# Patient Record
Sex: Male | Born: 1945
Health system: Southern US, Community
[De-identification: ages and names within clinical notes are randomized; demographics above are authoritative.]

## PROBLEM LIST (undated history)

## (undated) DIAGNOSIS — C61 Malignant neoplasm of prostate: Secondary | ICD-10-CM

## (undated) HISTORY — PX: MANDIBLE RECONSTRUCTION: SHX431

## (undated) HISTORY — PX: UMBILICAL HERNIA REPAIR: SHX196

## (undated) HISTORY — PX: LEG SURGERY: SHX1003

## (undated) HISTORY — PX: CATARACT EXTRACTION: SUR2

---

## 1998-04-11 ENCOUNTER — Emergency Department (HOSPITAL_COMMUNITY): Admission: EM | Admit: 1998-04-11 | Discharge: 1998-04-11 | Payer: Self-pay | Admitting: Emergency Medicine

## 2001-05-10 ENCOUNTER — Inpatient Hospital Stay (HOSPITAL_COMMUNITY): Admission: AC | Admit: 2001-05-10 | Discharge: 2001-05-14 | Payer: Self-pay

## 2001-05-10 ENCOUNTER — Encounter: Payer: Self-pay | Admitting: Emergency Medicine

## 2001-05-11 ENCOUNTER — Encounter: Payer: Self-pay | Admitting: General Surgery

## 2001-05-11 ENCOUNTER — Encounter: Payer: Self-pay | Admitting: Orthopedic Surgery

## 2001-05-13 ENCOUNTER — Encounter: Payer: Self-pay | Admitting: General Surgery

## 2001-05-14 ENCOUNTER — Inpatient Hospital Stay (HOSPITAL_COMMUNITY)
Admission: RE | Admit: 2001-05-14 | Discharge: 2001-05-20 | Payer: Self-pay | Admitting: Physical Medicine & Rehabilitation

## 2002-05-21 ENCOUNTER — Emergency Department (HOSPITAL_COMMUNITY): Admission: EM | Admit: 2002-05-21 | Discharge: 2002-05-21 | Payer: Self-pay | Admitting: Emergency Medicine

## 2010-09-13 ENCOUNTER — Other Ambulatory Visit: Payer: Self-pay

## 2010-09-13 ENCOUNTER — Inpatient Hospital Stay (HOSPITAL_COMMUNITY): Payer: Self-pay

## 2010-09-13 ENCOUNTER — Encounter: Payer: Self-pay | Admitting: *Deleted

## 2010-09-13 ENCOUNTER — Inpatient Hospital Stay (HOSPITAL_COMMUNITY)
Admission: EM | Admit: 2010-09-13 | Discharge: 2010-09-14 | DRG: 641 | Disposition: A | Discharge status: Home / Self Care | Payer: Self-pay | Attending: Internal Medicine | Admitting: Internal Medicine

## 2010-09-13 DIAGNOSIS — E860 Dehydration: Principal | ICD-10-CM | POA: Diagnosis present

## 2010-09-13 DIAGNOSIS — I498 Other specified cardiac arrhythmias: Secondary | ICD-10-CM | POA: Diagnosis present

## 2010-09-13 DIAGNOSIS — R001 Bradycardia, unspecified: Secondary | ICD-10-CM | POA: Diagnosis present

## 2010-09-13 DIAGNOSIS — R55 Syncope and collapse: Secondary | ICD-10-CM | POA: Diagnosis present

## 2010-09-13 LAB — COMPREHENSIVE METABOLIC PANEL
ALT: 14 U/L (ref 0–53)
Albumin: 3.8 g/dL (ref 3.5–5.2)
Alkaline Phosphatase: 73 U/L (ref 39–117)
Potassium: 4 mEq/L (ref 3.5–5.1)
Sodium: 138 mEq/L (ref 135–145)
Total Protein: 7.1 g/dL (ref 6.0–8.3)

## 2010-09-13 LAB — CBC
MCH: 31.7 pg (ref 26.0–34.0)
MCHC: 34.6 g/dL (ref 30.0–36.0)
Platelets: 192 10*3/uL (ref 150–400)
RBC: 4.16 MIL/uL — ABNORMAL LOW (ref 4.22–5.81)

## 2010-09-13 LAB — DIFFERENTIAL
Basophils Absolute: 0 10*3/uL (ref 0.0–0.1)
Basophils Relative: 1 % (ref 0–1)
Eosinophils Absolute: 0.3 10*3/uL (ref 0.0–0.7)
Neutrophils Relative %: 61 % (ref 43–77)

## 2010-09-13 MED ORDER — ONDANSETRON HCL 4 MG/2ML IJ SOLN: 4.0000 mg | Freq: Four times a day (QID) | INTRAMUSCULAR | Status: DC | PRN

## 2010-09-13 MED ORDER — SODIUM CHLORIDE 0.9 % IJ SOLN: 3.0000 mL | INTRAMUSCULAR | Status: DC | PRN

## 2010-09-13 MED ORDER — SODIUM CHLORIDE 0.9 % IJ SOLN
3.0000 mL | Freq: Two times a day (BID) | INTRAMUSCULAR | Status: DC
  Administered 2010-09-13 – 2010-09-14 (×2): 3 mL via INTRAVENOUS
  Filled 2010-09-13: qty 3

## 2010-09-13 MED ORDER — ONDANSETRON HCL 4 MG PO TABS: 4.0000 mg | ORAL_TABLET | Freq: Four times a day (QID) | ORAL | Status: DC | PRN

## 2010-09-13 MED ORDER — SODIUM CHLORIDE 0.9 % IV SOLN: 250.0000 mL | INTRAVENOUS | Status: DC

## 2010-09-13 NOTE — ED Notes (Signed)
C/o "almost passed out" walking from bathroom to bedroom at home; pt diaphoretic upon EMS arrival; skin cool and clammy presently; CBG en route 109 mg/dl; a&ox4; answers questions appropriately.

## 2010-09-13 NOTE — ED Notes (Signed)
Called floor back and was able to give report, report called to Morrie Sheldon, RN on unit 300

## 2010-09-13 NOTE — ED Notes (Signed)
Attempted to call report, nurse unable to take report and will call back when able, receiving nurse is Tish

## 2010-09-13 NOTE — ED Notes (Signed)
Nurse called back and unable to give

## 2010-09-13 NOTE — ED Notes (Signed)
Placed on cardiac monitor and O2 2L/min per [place]; monitor shows sinus brady with rate of 48.

## 2010-09-13 NOTE — ED Provider Notes (Signed)
Scribed for Troy Hutching, MD, the patient was seen in room 6. This chart was scribed by Jannette Fogo. This patient's care was started at 15:35.    CSN: [id number] Arrival date & time: 09/13/2010  2:13 PM  Chief Complaint  Patient presents with  . Near Syncope   HPI Troy Walter is a 65 y.o. male brought in by ambulance, who presents to the Emergency Department for near-syncope. Patient reports he developed a sudden onset of lightheadedness/dizziness and "almost passed out" today at 13:30PM while walking from bathroom to bedroom. Per spouse, he became pale and diaphoretic, and "was out of it" for ~ 1 minute. Spouse states it took ~5 minutes for the patient to start acting appropriately. Additionally, family reports a history of similar near-syncopal episode ~2 days ago.  Patient recently moved to [place] from [place] and does not have a PCP int his area. He was recently treated for testicular swelling with antibiotics and symptoms improved. Patient denies any history HTN, diabetes mellitus, CVA, AMI, or any chronic medical problems. There are no other associated symptoms and no other alleviating or aggravating factors.   HPI ELEMENTS:  Location: generalized   Onset: Today at 13:30PM Duration: ~5 minutes   Severity: no pain    Context: as above    PAST MEDICAL HISTORY:  Hernia  History reviewed. No pertinent past medical history.   PAST SURGICAL HISTORY:  History reviewed. No pertinent past surgical history.   MEDICATIONS:  Previous Medications   POTASSIUM CHLORIDE (KLOR-CON) 8 MEQ CR TABLET    Take 8 mEq by mouth daily.      ALLERGIES:  Allergies as of 09/13/2010  . (No Known Allergies)     FAMILY HISTORY:  No Pertinent Family History  SOCIAL HISTORY: Accompanied to the ED by spouse History  Substance Use Topics  . Smoking status: Never Smoker   . Smokeless tobacco: Not on file  . Alcohol Use: No    Review of Systems  Constitutional: Positive for diaphoresis.    Neurological: Positive for dizziness, syncope and light-headedness.  All other systems reviewed and are negative.    Physical Exam  BP 138/81  Pulse 53  Temp(Src) 97.7 F (36.5 C) (Oral)  Resp 18  Ht 5\' 11"  (1.803 m)  Wt 211 lb (95.709 kg)  BMI 29.43 kg/m2  SpO2 98%  Physical Exam  Constitutional: He is oriented to person, place, and time. He appears well-developed and well-nourished. No distress.  HENT:  Head: Normocephalic and atraumatic.  Mouth/Throat: Oropharynx is clear and moist.  Eyes: Pupils are equal, round, and reactive to light.  Neck: Normal range of motion. Neck supple.  Cardiovascular: Regular rhythm and normal heart sounds.  Bradycardia present.   Pulmonary/Chest: Effort normal and breath sounds normal.  Abdominal: Soft. There is no tenderness.       Umbilical hernia, easily reduced   Musculoskeletal: Normal range of motion. He exhibits no edema and no tenderness.  Neurological: He is alert and oriented to person, place, and time.  Skin: Skin is warm and dry. He is not diaphoretic. No pallor.  Psychiatric: He has a normal mood and affect. His behavior is normal.   OTHER DATA REVIEWED: Nursing notes, vital signs, and past medical records reviewed.  DIAGNOSTIC STUDIES: Oxygen Saturation is 98% on [place], adequate by my interpretation.    Cardiac Monitor: bradycardia with a rate of 52 bpm. No ectopy.   EKG:  Date: 09/13/2010  Rate: 48  Rhythm: sinus bradycardia  QRS Axis: normal  Intervals: normal  ST/T Wave abnormalities: normal  Conduction Disutrbances:first-degree A-V block   Narrative Interpretation:   Old EKG Reviewed: none available   LABS / RADIOLOGY:  Results for orders placed during the hospital encounter of 09/13/10  CBC      Component Value Range   WBC 5.4  4.0 - 10.5 (K/uL)   RBC 4.16 (*) 4.22 - 5.81 (MIL/uL)   Hemoglobin 13.2  13.0 - 17.0 (g/dL)   HCT 16.1 (*) 09.6 - 52.0 (%)   MCV 91.8  78.0 - 100.0 (fL)   MCH 31.7  26.0 - 34.0 (pg)    MCHC 34.6  30.0 - 36.0 (g/dL)   RDW 04.5  40.9 - 81.1 (%)   Platelets 192  150 - 400 (K/uL)  DIFFERENTIAL      Component Value Range   Neutrophils Relative 61  43 - 77 (%)   Neutro Abs 3.3  1.7 - 7.7 (K/uL)   Lymphocytes Relative 28  12 - 46 (%)   Lymphs Abs 1.5  0.7 - 4.0 (K/uL)   Monocytes Relative 5  3 - 12 (%)   Monocytes Absolute 0.3  0.1 - 1.0 (K/uL)   Eosinophils Relative 5  0 - 5 (%)   Eosinophils Absolute 0.3  0.0 - 0.7 (K/uL)   Basophils Relative 1  0 - 1 (%)   Basophils Absolute 0.0  0.0 - 0.1 (K/uL)  COMPREHENSIVE METABOLIC PANEL      Component Value Range   Sodium 138  135 - 145 (mEq/L)   Potassium 4.0  3.5 - 5.1 (mEq/L)   Chloride 104  96 - 112 (mEq/L)   CO2 29  19 - 32 (mEq/L)   Glucose, Bld 97  70 - 99 (mg/dL)   BUN 10  6 - 23 (mg/dL)   Creatinine, Ser 9.14  0.50 - 1.35 (mg/dL)   Calcium 9.3  8.4 - 78.2 (mg/dL)   Total Protein 7.1  6.0 - 8.3 (g/dL)   Albumin 3.8  3.5 - 5.2 (g/dL)   AST 17  0 - 37 (U/L)   ALT 14  0 - 53 (U/L)   Alkaline Phosphatase 73  39 - 117 (U/L)   Total Bilirubin 0.9  0.3 - 1.2 (mg/dL)   GFR calc non Af Amer >60  >60 (mL/min)   GFR calc Af Amer >60  >60 (mL/min)    ED COURSE / COORDINATION OF CARE: 15:40 - Patient was evaluated by ED physician. Labs and EKG ordered.  19:25 - The case was discussed with [facility] Hospitalist regarding admission   MDM:    IMPRESSION: Diagnoses that have been ruled out:  Diagnoses that are still under consideration:  Final diagnoses:  Near syncope    PLAN:  ADMISSION   CONDITION ON ADMISSION: Stable   MEDICATIONS GIVEN IN THE E.D.  Medications  potassium chloride (KLOR-CON) 8 MEQ CR tablet (not administered)    Procedures   I personally performed the services described in this documentation, which was scribed in my presence. The recorded information has been reviewed and considered. No att. providers found     Troy Hutching, MD 09/30/10 1616

## 2010-09-14 ENCOUNTER — Inpatient Hospital Stay (HOSPITAL_COMMUNITY): Payer: Self-pay

## 2010-09-14 DIAGNOSIS — R55 Syncope and collapse: Secondary | ICD-10-CM | POA: Diagnosis present

## 2010-09-14 DIAGNOSIS — R001 Bradycardia, unspecified: Secondary | ICD-10-CM | POA: Diagnosis present

## 2010-09-14 LAB — TSH: TSH: 1.816 u[IU]/mL (ref 0.350–4.500)

## 2010-09-14 LAB — CARDIAC PANEL(CRET KIN+CKTOT+MB+TROPI)
CK, MB: 4.5 ng/mL — ABNORMAL HIGH (ref 0.3–4.0)
Relative Index: 1.9 (ref 0.0–2.5)
Total CK: 212 U/L (ref 7–232)

## 2010-09-14 LAB — BASIC METABOLIC PANEL
Chloride: 105 mEq/L (ref 96–112)
Creatinine, Ser: 1 mg/dL (ref 0.50–1.35)
GFR calc Af Amer: >60 mL/min (ref >60)
GFR calc non Af Amer: >60 mL/min (ref >60)
Potassium: 3.9 mEq/L (ref 3.5–5.1)

## 2010-09-14 LAB — CBC
MCH: 32.4 pg (ref 26.0–34.0)
MCHC: 35.3 g/dL (ref 30.0–36.0)
RDW: 13.2 % (ref 11.5–15.5)

## 2010-09-14 NOTE — Discharge Summary (Signed)
Physician Discharge Summary  Patient ID: Troy Walter MRN: [id number] DOB/AGE: [birth year] 65 y.o.  Admit date: 09/13/2010 Discharge date: 09/14/2010  Discharge Diagnoses:  Principal Problem:  *Syncope Active Problems:  Bradycardia   Current Discharge Medication List    STOP taking these medications     potassium chloride (KLOR-CON) 8 MEQ CR tablet         Discharge Orders    Future Orders Please Complete By Expires   Diet general      Increase activity slowly         Follow-up Information    Follow up with Milinda Antis, MD. Make an appointment in 2 weeks.   Contact information:   [address], [address] [place] [address] [phone]          Disposition:  home  Discharged Condition: Stable  Labs:   Results for orders placed during the hospital encounter of 09/13/10 (from the past 48 hour(s))  CBC     Status: Abnormal   Collection Time   09/13/10  4:31 PM      Component Value Range Comment   WBC 5.4  4.0 - 10.5 (K/uL)    RBC 4.16 (*) 4.22 - 5.81 (MIL/uL)    Hemoglobin 13.2  13.0 - 17.0 (g/dL)    HCT 46.9 (*) 62.9 - 52.0 (%)    MCV 91.8  78.0 - 100.0 (fL)    MCH 31.7  26.0 - 34.0 (pg)    MCHC 34.6  30.0 - 36.0 (g/dL)    RDW 52.8  41.3 - 24.4 (%)    Platelets 192  150 - 400 (K/uL)   DIFFERENTIAL     Status: Normal   Collection Time   09/13/10  4:31 PM      Component Value Range Comment   Neutrophils Relative 61  43 - 77 (%)    Neutro Abs 3.3  1.7 - 7.7 (K/uL)    Lymphocytes Relative 28  12 - 46 (%)    Lymphs Abs 1.5  0.7 - 4.0 (K/uL)    Monocytes Relative 5  3 - 12 (%)    Monocytes Absolute 0.3  0.1 - 1.0 (K/uL)    Eosinophils Relative 5  0 - 5 (%)    Eosinophils Absolute 0.3  0.0 - 0.7 (K/uL)    Basophils Relative 1  0 - 1 (%)    Basophils Absolute 0.0  0.0 - 0.1 (K/uL)   COMPREHENSIVE METABOLIC PANEL     Status: Normal   Collection Time   09/13/10  4:31 PM      Component Value Range Comment   Sodium 138  135 - 145 (mEq/L)      Potassium 4.0  3.5 - 5.1 (mEq/L)    Chloride 104  96 - 112 (mEq/L)    CO2 29  19 - 32 (mEq/L)    Glucose, Bld 97  70 - 99 (mg/dL)    BUN 10  6 - 23 (mg/dL)    Creatinine, Ser 0.10  0.50 - 1.35 (mg/dL)    Calcium 9.3  8.4 - 10.5 (mg/dL)    Total Protein 7.1  6.0 - 8.3 (g/dL)    Albumin 3.8  3.5 - 5.2 (g/dL)    AST 17  0 - 37 (U/L)    ALT 14  0 - 53 (U/L)    Alkaline Phosphatase 73  39 - 117 (U/L)    Total Bilirubin 0.9  0.3 - 1.2 (mg/dL)    GFR calc non  Af Amer >60  >60 (mL/min)    GFR calc Af Amer >60  >60 (mL/min)   CARDIAC PANEL(CRET KIN+CKTOT+MB+TROPI)     Status: Abnormal   Collection Time   09/14/10 12:12 AM      Component Value Range Comment   Total CK 240 (*) 7 - 232 (U/L)    CK, MB 4.5 (*) 0.3 - 4.0 (ng/mL)    Troponin I <0.30  <0.30 (ng/mL)    Relative Index 1.9  0.0 - 2.5    BASIC METABOLIC PANEL     Status: Normal   Collection Time   09/14/10  8:08 AM      Component Value Range Comment   Sodium 140  135 - 145 (mEq/L)    Potassium 3.9  3.5 - 5.1 (mEq/L)    Chloride 105  96 - 112 (mEq/L)    CO2 31  19 - 32 (mEq/L)    Glucose, Bld 98  70 - 99 (mg/dL)    BUN 9  6 - 23 (mg/dL)    Creatinine, Ser 0.98  0.50 - 1.35 (mg/dL)    Calcium 9.4  8.4 - 10.5 (mg/dL)    GFR calc non Af Amer >60  >60 (mL/min)    GFR calc Af Amer >60  >60 (mL/min)   CBC     Status: Normal   Collection Time   09/14/10  8:08 AM      Component Value Range Comment   WBC 4.5  4.0 - 10.5 (K/uL)    RBC 4.47  4.22 - 5.81 (MIL/uL)    Hemoglobin 14.5  13.0 - 17.0 (g/dL)    HCT 11.9  14.7 - 82.9 (%)    MCV 91.9  78.0 - 100.0 (fL)    MCH 32.4  26.0 - 34.0 (pg)    MCHC 35.3  30.0 - 36.0 (g/dL)    RDW 56.2  13.0 - 86.5 (%)    Platelets 195  150 - 400 (K/uL)   CARDIAC PANEL(CRET KIN+CKTOT+MB+TROPI)     Status: Abnormal   Collection Time   09/14/10  8:09 AM      Component Value Range Comment   Total CK 212  7 - 232 (U/L)    CK, MB 4.5 (*) 0.3 - 4.0 (ng/mL)    Troponin I <0.30  <0.30 (ng/mL)     Relative Index 2.1  0.0 - 2.5      Diagnostics:  X-ray Chest Pa And Lateral   09/13/2010  *RADIOLOGY REPORT*  Clinical Data: Near syncope; lightheadedness and dizziness.  CHEST - 2 VIEW  Comparison: None.  Findings: The lungs are well-aerated.  Mild left basilar opacity likely reflects atelectasis.  There is no evidence of pleural effusion or pneumothorax.  The heart is normal in size; the mediastinal contour is within normal limits.  No acute osseous abnormalities are seen.  IMPRESSION: Mild left basilar opacity likely reflects atelectasis; lungs otherwise clear.  Original Report Authenticated By: Tonia Ghent, M.D.   Ct Head Wo Contrast  09/13/2010  *RADIOLOGY REPORT*  Clinical Data: Near syncope; dizziness and weakness.  CT HEAD WITHOUT CONTRAST  Technique:  Contiguous axial images were obtained from the base of the skull through the vertex without contrast.  Comparison: None.  Findings: There is no evidence of acute infarction, mass lesion, or intra- or extra-axial hemorrhage on CT.  The posterior fossa, including the cerebellum, brainstem and fourth ventricle, is within normal limits.  The third and lateral ventricles, and basal ganglia are unremarkable in appearance.  The cerebral hemispheres are symmetric in appearance, with normal gray- white differentiation.  No mass effect or midline shift is seen.  There is no evidence of fracture; visualized osseous structures are unremarkable in appearance.  The orbits are within normal limits. The paranasal sinuses and mastoid air cells are well-aerated.  No significant soft tissue abnormalities are seen.  IMPRESSION: Unremarkable noncontrast CT of the head.  Original Report Authenticated By: Tonia Ghent, M.D.   US Carotid Duplex Bilateral  09/14/2010  *RADIOLOGY REPORT*  Clinical Data: Syncope  BILATERAL CAROTID DUPLEX ULTRASOUND  Technique: Wallace Cullens scale imaging, color Doppler and duplex ultrasound was performed of bilateral carotid and vertebral arteries  in the neck.  Comparison:  Head CT 09/13/2010  Criteria:  Quantification of carotid stenosis is based on velocity parameters that correlate the residual internal carotid diameter with NASCET-based stenosis levels, using the diameter of the distal internal carotid lumen as the denominator for stenosis measurement.  The following velocity measurements were obtained:                   PEAK SYSTOLIC/END DIASTOLIC RIGHT ICA:                        112/32cm/sec CCA:                        106/18cm/sec SYSTOLIC ICA/CCA RATIO:     1.1 DIASTOLIC ICA/CCA RATIO:    1.8 ECA:                        135cm/sec  LEFT ICA:                        86/14cm/sec CCA:                        133/18cm/sec SYSTOLIC ICA/CCA RATIO:     0.6 DIASTOLIC ICA/CCA RATIO:    0.8 ECA:                        98cm/sec  Findings:  RIGHT CAROTID ARTERY: Echogenic shadowing plaque is noted at the carotid bulb producing less than 10% stenosis by gray scale criteria.  RIGHT VERTEBRAL ARTERY:  Antegrade  LEFT CAROTID ARTERY: No significant plaque formation.  LEFT VERTEBRAL ARTERY:  Antegrade  Waveform morphologies are normal in the internal and external carotid arterial systems bilaterally.  IMPRESSION: No sonographic evidence for hemodynamically significant stenosis in either carotid arterial system.  Original Report Authenticated By: Harrel Lemon, M.D.   EKG: Sinus bradycardia  Full Code   Hospital Course: Please see H&P for complete admission details. The patient is a 65 year old black male with no significant past medical history who presented with syncope. He had taken some laxative tea and had loose stool. He also had not been drinking much and had been out in the sun. He was on the commode and had a syncopal episode. He had had a previous syncopal episode several days prior to this episode. He had been feeling weak recently. He thinks that he was dehydrated. He had no chest pain, palpitations, seizure or stroke symptoms. He had been feeling  dizzy when standing, malaise and fatigue. He has no primary care physician. He is very active and has chronic bradycardia. Other than bradycardia he had an unremarkable physical examination. He was admitted on a Friday night. Orthostatic  vital signs were negative. CT of the brain, carotid ultrasound, laboratory values, chest x-ray were all unremarkable. An echocardiogram was also ordered by the admitting physician, but this could not be done for several days, as it is the weekend. The patient feels "great" he has had no further symptoms. I suspect he was dehydrated. He remains bradycardic into the 40s and 50s, but he is currently asymptomatic, so unless he dropped even lower, I do not think that this led to his syncope. He is requesting discharge and at this point is stable. I did encourage him to establish a primary care physician. If he has further symptoms, further workup can be done like echocardiogram, Holter monitor, etc. Total time on the day of discharge is greater than 30 minutes.  Discharge Exam: Blood pressure 146/83, pulse 59, temperature 98.1 F (36.7 C), temperature source Oral, resp. rate 19, height 5\' 11"  (1.803 m), weight 95.6 kg (210 lb 12.2 oz), SpO2 98.00%. General alert oriented walking around the room without difficulty Lungs are clear to auscultation bilaterally without wheezes rhonchi or rales Cardiovascular Regular rhythm, mild bradycardia Abdomen soft nontender nondistended Extremities no clubbing cyanosis or edema. No calf tenderness.   SignedChristiane Ha 09/14/2010, 3:27 PM

## 2010-09-14 NOTE — Progress Notes (Signed)
09/14/10 1615 Patient discharged home this afternoon. D/c'd iv site within normal limits, d/c'd telemetry. Reviewed discharge instructions with patient and copy given. No prescriptions or home meds. Pt to schedule f/u appointment in 2 weeks with Dr Jeanice Lim as instructed per Dr Lendell Caprice. Denied pain or discomfort prior to discharge. Pt left floor in stable condition via ambulation (at patient request to walk) accompanied by nurse.

## 2010-09-14 NOTE — H&P (Signed)
NAMECHRISTAPHER, GILLIAN NO.:  [id number]  MEDICAL RECORD NO.:  [id number]  LOCATION:  A320                          FACILITY:  [facility]  PHYSICIAN:  Tarry Kos, MD       DATE OF BIRTH:  [birth year]  DATE OF ADMISSION:  09/13/2010 DATE OF DISCHARGE:  LH                             HISTORY & PHYSICAL   CHIEF COMPLAINT:  Syncope.  HISTORY OF PRESENT ILLNESS:  Mr. Dibartolo is a 65 year old very healthy individual who early this afternoon was having a bowel movement and got very dizzy, diaphoretic, and had a questionable period where he passed out for a couple minutes.  His wife was present.  She does not really know if he lost consciousness or not.  His eyes were open, but he was very diaphoretic, and his symptoms improved when he laid down on the couch.  He is very physically fit man they say that he normally is bradycardic when he goes to see his doctor.  He exercises frequently but this is not the first time that he has had this sort of event.  Several days ago, he got dizzy and also almost passed out.  He denies any recent fevers, nausea, or vomiting.  Denies any diarrhea.  He just recently started a cleansing treatment with some fluids, and he was going to have a bowel movement when this episode happened today.  He denies any bloody stools.  Denies any chest pain.  PAST MEDICAL HISTORY:  None.  ALLERGIES:  None.  MEDICATIONS:  He takes no prescribed medications.  SOCIAL HISTORY:  Nonsmoker.  No alcohol.  No IV drug abuse.  He is married.  REVIEW OF SYSTEMS:  Otherwise negative.  PHYSICAL EXAMINATION:  VITAL SIGNS:  Temperature is 98.2, pulse is low at 49 in the ED.  Respirations 17, blood pressure 137/76.  O2 sats 97% on room air. GENERAL:  He is alert and oriented x4.  No apparent stress, cooperative and friendly. HEENT:  Extraocular movements are intact.  Pupils are equal and reactive to light.  Oropharynx clear.  Mucous membranes moist. NECK:  No JVD.   No carotid bruits. COR:  Regular rate and rhythm without murmurs, rubs, or gallops. CHEST:  Clear to auscultation bilaterally.  No wheeze, rhonchi, or rales. ABDOMEN:  Soft, nontender, and nondistended.  Positive bowel sounds.  No hepatosplenomegaly. EXTREMITIES:  No clubbing, cyanosis, or edema. PSYCHIATRIC:  Normal mood and affect. NEUROLOGICAL:  No focal neurological deficits.  Cranial nerves II through XII grossly intact. SKIN:  No rashes.  LABORATORY DATA:  Twelve-lead EKG shows marked sinus bradycardia with a rate of 48 and a first-degree AV block.  Electrolytes are normal. Potassium is normal.  BUN and creatinine normal.  LFTs normal.  CBC is normal.  There has been no imaging studies done in the ED.  No CT of the head.  No chest x-ray.  No cardiac enzymes done yet.  ASSESSMENT/PLAN: 1. This is a 65 year old male with a syncopal episode with marked     sinus bradycardia. 2. Syncopal episode possibly due to sinus bradycardia, however, I am     going to obtain cardiac enzymes.  Chest x-ray and a  CT of his head.     We will also check 2-D echo of his heart along with carotid     Dopplers, obviously place him on telemetry floor to see if there is     any arrhythmias that warrant further evaluation for pacemaker     placement, I am going to check orthostatic vitals.  If his workup     is negative and his symptoms persists, would have a Cardiology     evaluation as his bradycardia may be the reason for his presyncopal     episodes.  The patient is full code.  Further recommendations     pending on overall hospital course.                                           ______________________________ Tarry Kos, MD     RD/MEDQ  D:  09/13/2010  T:  09/14/2010  Job:  [id number]

## 2010-09-25 NOTE — ED Notes (Signed)
I personally performed the services described in this documentation, which was scribed in my presence. The recorded information has been reviewed and considered. No att. providers found   [name], MD 09/25/10 2210

## 2012-07-08 ENCOUNTER — Encounter (HOSPITAL_COMMUNITY): Payer: Self-pay | Admitting: *Deleted

## 2012-07-08 ENCOUNTER — Emergency Department (HOSPITAL_COMMUNITY): Payer: Medicare HMO

## 2012-07-08 ENCOUNTER — Emergency Department (HOSPITAL_COMMUNITY)
Admission: EM | Admit: 2012-07-08 | Discharge: 2012-07-08 | Disposition: A | Discharge status: Home / Self Care | Payer: Medicare HMO | Attending: Emergency Medicine | Admitting: Emergency Medicine

## 2012-07-08 DIAGNOSIS — R509 Fever, unspecified: Secondary | ICD-10-CM | POA: Insufficient documentation

## 2012-07-08 DIAGNOSIS — R0982 Postnasal drip: Secondary | ICD-10-CM | POA: Insufficient documentation

## 2012-07-08 DIAGNOSIS — J9801 Acute bronchospasm: Secondary | ICD-10-CM | POA: Insufficient documentation

## 2012-07-08 DIAGNOSIS — J3489 Other specified disorders of nose and nasal sinuses: Secondary | ICD-10-CM | POA: Insufficient documentation

## 2012-07-08 DIAGNOSIS — J069 Acute upper respiratory infection, unspecified: Secondary | ICD-10-CM | POA: Insufficient documentation

## 2012-07-08 MED ORDER — ALBUTEROL SULFATE HFA 108 (90 BASE) MCG/ACT IN AERS
2.0000 | INHALATION_SPRAY | Freq: Once | RESPIRATORY_TRACT | Status: AC
  Administered 2012-07-08: 2 via RESPIRATORY_TRACT
  Filled 2012-07-08: qty 6.7
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
## 2012-07-08 NOTE — ED Notes (Signed)
Cough x 1 week, fever earlier but denies now, yellow phlegm

## 2012-07-08 NOTE — ED Provider Notes (Signed)
Medical screening examination/treatment/procedure(s) were performed by non-physician practitioner and as supervising physician I was immediately available for consultation/collaboration.  Theophil Thivierge R. Loann Chahal, MD 07/08/12 2352 

## 2012-08-26 ENCOUNTER — Encounter (INDEPENDENT_AMBULATORY_CARE_PROVIDER_SITE_OTHER): Payer: Medicare PPO | Admitting: General Surgery

## 2012-09-15 ENCOUNTER — Encounter (INDEPENDENT_AMBULATORY_CARE_PROVIDER_SITE_OTHER): Payer: Medicare PPO | Admitting: General Surgery

## 2012-09-20 ENCOUNTER — Encounter (INDEPENDENT_AMBULATORY_CARE_PROVIDER_SITE_OTHER): Payer: Self-pay | Admitting: General Surgery

## 2014-06-13 ENCOUNTER — Encounter (HOSPITAL_COMMUNITY): Payer: Self-pay

## 2014-06-13 ENCOUNTER — Emergency Department (HOSPITAL_COMMUNITY)
Admission: EM | Admit: 2014-06-13 | Discharge: 2014-06-14 | Disposition: A | Discharge status: Home / Self Care | Payer: Medicare Other | Attending: Emergency Medicine | Admitting: Emergency Medicine

## 2014-06-13 ENCOUNTER — Emergency Department (HOSPITAL_COMMUNITY): Payer: Medicare Other

## 2014-06-13 DIAGNOSIS — Z9889 Other specified postprocedural states: Secondary | ICD-10-CM | POA: Insufficient documentation

## 2014-06-13 DIAGNOSIS — K567 Ileus, unspecified: Secondary | ICD-10-CM | POA: Diagnosis not present

## 2014-06-13 DIAGNOSIS — M549 Dorsalgia, unspecified: Secondary | ICD-10-CM | POA: Diagnosis not present

## 2014-06-13 DIAGNOSIS — R109 Unspecified abdominal pain: Secondary | ICD-10-CM

## 2014-06-13 DIAGNOSIS — Z79899 Other long term (current) drug therapy: Secondary | ICD-10-CM | POA: Diagnosis not present

## 2014-06-13 DIAGNOSIS — E278 Other specified disorders of adrenal gland: Secondary | ICD-10-CM | POA: Diagnosis not present

## 2014-06-13 DIAGNOSIS — R1030 Lower abdominal pain, unspecified: Secondary | ICD-10-CM | POA: Diagnosis present

## 2014-06-13 LAB — URINALYSIS, ROUTINE W REFLEX MICROSCOPIC
Bilirubin Urine: NEGATIVE
Glucose, UA: NEGATIVE mg/dL
Hgb urine dipstick: NEGATIVE
KETONES UR: NEGATIVE mg/dL
LEUKOCYTES UA: NEGATIVE
NITRITE: NEGATIVE
PH: 6 (ref 5.0–8.0)
Protein, ur: NEGATIVE mg/dL
SPECIFIC GRAVITY, URINE: 1.02 (ref 1.005–1.030)
UROBILINOGEN UA: 0.2 mg/dL (ref 0.0–1.0)

## 2014-06-13 LAB — CBC WITH DIFFERENTIAL/PLATELET
Basophils Absolute: 0 10*3/uL (ref 0.0–0.1)
Basophils Relative: 0 % (ref 0–1)
EOS PCT: 7 % — AB (ref 0–5)
Eosinophils Absolute: 0.4 10*3/uL (ref 0.0–0.7)
HEMATOCRIT: 40.6 % (ref 39.0–52.0)
HEMOGLOBIN: 13.8 g/dL (ref 13.0–17.0)
LYMPHS ABS: 2.5 10*3/uL (ref 0.7–4.0)
LYMPHS PCT: 45 % (ref 12–46)
MCH: 31.9 pg (ref 26.0–34.0)
MCHC: 34 g/dL (ref 30.0–36.0)
MCV: 94 fL (ref 78.0–100.0)
MONO ABS: 0.3 10*3/uL (ref 0.1–1.0)
Monocytes Relative: 6 % (ref 3–12)
Neutro Abs: 2.4 10*3/uL (ref 1.7–7.7)
Neutrophils Relative %: 42 % — ABNORMAL LOW (ref 43–77)
Platelets: 178 10*3/uL (ref 150–400)
RBC: 4.32 MIL/uL (ref 4.22–5.81)
RDW: 13.2 % (ref 11.5–15.5)
WBC: 5.6 10*3/uL (ref 4.0–10.5)

## 2014-06-13 LAB — COMPREHENSIVE METABOLIC PANEL
ALT: 15 U/L — ABNORMAL LOW (ref 17–63)
ANION GAP: 5 (ref 5–15)
AST: 19 U/L (ref 15–41)
Albumin: 3.9 g/dL (ref 3.5–5.0)
Alkaline Phosphatase: 73 U/L (ref 38–126)
BUN: 13 mg/dL (ref 6–20)
CALCIUM: 8.9 mg/dL (ref 8.9–10.3)
CO2: 29 mmol/L (ref 22–32)
CREATININE: 1.13 mg/dL (ref 0.61–1.24)
Chloride: 105 mmol/L (ref 101–111)
GLUCOSE: 91 mg/dL (ref 65–99)
Potassium: 4.4 mmol/L (ref 3.5–5.1)
Sodium: 139 mmol/L (ref 135–145)
Total Bilirubin: 0.9 mg/dL (ref 0.3–1.2)
Total Protein: 6.9 g/dL (ref 6.5–8.1)

## 2014-06-13 MED ORDER — IOHEXOL 300 MG/ML  SOLN
100.0000 mL | Freq: Once | INTRAMUSCULAR | Status: AC | PRN
  Administered 2014-06-13: 100 mL via INTRAVENOUS

## 2014-06-13 MED ORDER — IOHEXOL 300 MG/ML  SOLN
50.0000 mL | Freq: Once | INTRAMUSCULAR | Status: AC | PRN
  Administered 2014-06-13: 50 mL via ORAL

## 2014-06-13 NOTE — ED Notes (Signed)
I had an umbilical hernia repair done yesterday and my abdomen is still full of air. He is having pain on the left side per family. He has not taken his pain medication since this morning. He has not had a bowel movement since the surgery. He has taken MOM this morning per family.
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
## 2014-06-14 MED ORDER — DOCUSATE SODIUM 100 MG PO CAPS: 100.0000 mg | ORAL_CAPSULE | Freq: Two times a day (BID) | ORAL | Status: DC

## 2014-06-14 MED ORDER — POLYETHYLENE GLYCOL 3350 17 GM/SCOOP PO POWD: 17.0000 g | Freq: Two times a day (BID) | ORAL | Status: DC

## 2014-06-14 MED ORDER — AZITHROMYCIN 250 MG PO TABS: 250.0000 mg | ORAL_TABLET | Freq: Every day | ORAL | Status: DC

## 2014-06-14 MED ORDER — MAGNESIUM CITRATE PO SOLN
1.0000 | Freq: Once | ORAL | Status: AC
  Administered 2014-06-14: 1 via ORAL
  Filled 2014-06-14: qty 296

## 2014-06-14 NOTE — Discharge Instructions (Signed)
Please call your doctor for a followup appointment within 24-48 hours. When you talk to your doctor please let them know that you were seen in the emergency department and have them acquire all of your records so that they can discuss the findings with you and formulate a treatment plan to fully care for your new and ongoing problems. ° °

## 2014-06-24 ENCOUNTER — Encounter (HOSPITAL_COMMUNITY): Payer: Self-pay | Admitting: *Deleted

## 2014-06-24 ENCOUNTER — Emergency Department (HOSPITAL_COMMUNITY)
Admission: EM | Admit: 2014-06-24 | Discharge: 2014-06-24 | Disposition: A | Discharge status: Home / Self Care | Payer: Medicare Other | Attending: Emergency Medicine | Admitting: Emergency Medicine

## 2014-06-24 DIAGNOSIS — W57XXXA Bitten or stung by nonvenomous insect and other nonvenomous arthropods, initial encounter: Secondary | ICD-10-CM | POA: Diagnosis not present

## 2014-06-24 DIAGNOSIS — Y998 Other external cause status: Secondary | ICD-10-CM | POA: Insufficient documentation

## 2014-06-24 DIAGNOSIS — S70362A Insect bite (nonvenomous), left thigh, initial encounter: Secondary | ICD-10-CM | POA: Insufficient documentation

## 2014-06-24 DIAGNOSIS — Y9289 Other specified places as the place of occurrence of the external cause: Secondary | ICD-10-CM | POA: Diagnosis not present

## 2014-06-24 DIAGNOSIS — Y9389 Activity, other specified: Secondary | ICD-10-CM | POA: Diagnosis not present

## 2014-06-24 NOTE — ED Notes (Signed)
Tick imbedded in L upper inner thigh, found about 30 minutes ago.

## 2014-06-24 NOTE — Discharge Instructions (Signed)
Write on the calender today that the tick bite occurred. Watch for any signs of lime disease or RMSF. Return as needed.
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
## 2015-06-20 ENCOUNTER — Other Ambulatory Visit: Payer: Self-pay | Admitting: Family Medicine

## 2015-06-20 DIAGNOSIS — Z136 Encounter for screening for cardiovascular disorders: Secondary | ICD-10-CM

## 2017-08-20 DIAGNOSIS — H9190 Unspecified hearing loss, unspecified ear: Secondary | ICD-10-CM | POA: Diagnosis not present

## 2017-08-20 DIAGNOSIS — Z79899 Other long term (current) drug therapy: Secondary | ICD-10-CM | POA: Diagnosis not present

## 2017-08-20 DIAGNOSIS — Z0001 Encounter for general adult medical examination with abnormal findings: Secondary | ICD-10-CM | POA: Diagnosis not present

## 2017-08-20 DIAGNOSIS — N401 Enlarged prostate with lower urinary tract symptoms: Secondary | ICD-10-CM | POA: Diagnosis not present

## 2017-08-20 DIAGNOSIS — E7800 Pure hypercholesterolemia, unspecified: Secondary | ICD-10-CM | POA: Diagnosis not present

## 2017-08-20 DIAGNOSIS — Z136 Encounter for screening for cardiovascular disorders: Secondary | ICD-10-CM | POA: Diagnosis not present

## 2017-08-20 DIAGNOSIS — R7301 Impaired fasting glucose: Secondary | ICD-10-CM | POA: Diagnosis not present

## 2017-08-20 DIAGNOSIS — Z125 Encounter for screening for malignant neoplasm of prostate: Secondary | ICD-10-CM | POA: Diagnosis not present

## 2017-10-07 DIAGNOSIS — D125 Benign neoplasm of sigmoid colon: Secondary | ICD-10-CM | POA: Diagnosis not present

## 2017-10-07 DIAGNOSIS — Z86010 Personal history of colonic polyps: Secondary | ICD-10-CM | POA: Diagnosis not present

## 2017-10-07 DIAGNOSIS — K648 Other hemorrhoids: Secondary | ICD-10-CM | POA: Diagnosis not present

## 2017-10-07 DIAGNOSIS — D123 Benign neoplasm of transverse colon: Secondary | ICD-10-CM | POA: Diagnosis not present

## 2017-10-09 DIAGNOSIS — D125 Benign neoplasm of sigmoid colon: Secondary | ICD-10-CM | POA: Diagnosis not present

## 2017-10-09 DIAGNOSIS — D123 Benign neoplasm of transverse colon: Secondary | ICD-10-CM | POA: Diagnosis not present

## 2017-10-16 ENCOUNTER — Ambulatory Visit (INDEPENDENT_AMBULATORY_CARE_PROVIDER_SITE_OTHER): Payer: Medicare HMO | Admitting: Urology

## 2017-10-16 ENCOUNTER — Other Ambulatory Visit: Payer: Self-pay | Admitting: Urology

## 2017-10-16 DIAGNOSIS — R972 Elevated prostate specific antigen [PSA]: Secondary | ICD-10-CM

## 2017-10-16 DIAGNOSIS — R351 Nocturia: Secondary | ICD-10-CM | POA: Diagnosis not present

## 2017-10-16 DIAGNOSIS — N403 Nodular prostate with lower urinary tract symptoms: Secondary | ICD-10-CM | POA: Diagnosis not present

## 2017-10-30 ENCOUNTER — Ambulatory Visit (HOSPITAL_COMMUNITY)
Admission: RE | Admit: 2017-10-30 | Discharge: 2017-10-30 | Disposition: A | Discharge status: Home / Self Care | Payer: Medicare HMO | Source: Ambulatory Visit | Attending: Urology | Admitting: Urology

## 2017-10-30 ENCOUNTER — Encounter (HOSPITAL_COMMUNITY): Payer: Self-pay

## 2017-10-30 DIAGNOSIS — R972 Elevated prostate specific antigen [PSA]: Secondary | ICD-10-CM

## 2017-10-30 DIAGNOSIS — C61 Malignant neoplasm of prostate: Secondary | ICD-10-CM | POA: Insufficient documentation

## 2017-10-30 MED ORDER — LIDOCAINE HCL (PF) 2 % IJ SOLN
INTRAMUSCULAR | Status: AC
  Administered 2017-10-30: 10 mL
  Filled 2017-10-30: qty 10

## 2017-10-30 MED ORDER — CEFTRIAXONE SODIUM 1 G IJ SOLR
1.0000 g | Freq: Once | INTRAMUSCULAR | Status: AC
  Administered 2017-10-30: 1 g via INTRAMUSCULAR

## 2017-10-30 MED ORDER — CEFTRIAXONE SODIUM 1 G IJ SOLR
INTRAMUSCULAR | Status: AC
  Administered 2017-10-30: 1 g via INTRAMUSCULAR
  Filled 2017-10-30: qty 10

## 2017-10-30 MED ORDER — LIDOCAINE HCL (PF) 1 % IJ SOLN
INTRAMUSCULAR | Status: AC
  Administered 2017-10-30: 2.1 mL
  Filled 2017-10-30: qty 5

## 2017-11-06 ENCOUNTER — Other Ambulatory Visit: Payer: Self-pay | Admitting: Urology

## 2017-11-06 DIAGNOSIS — C61 Malignant neoplasm of prostate: Secondary | ICD-10-CM

## 2017-11-12 ENCOUNTER — Encounter (HOSPITAL_COMMUNITY): Payer: Self-pay

## 2017-11-12 ENCOUNTER — Encounter (HOSPITAL_COMMUNITY): Payer: Medicare HMO

## 2017-11-12 ENCOUNTER — Other Ambulatory Visit: Payer: Self-pay | Admitting: Urology

## 2017-11-12 DIAGNOSIS — C61 Malignant neoplasm of prostate: Secondary | ICD-10-CM

## 2017-11-17 ENCOUNTER — Encounter (HOSPITAL_COMMUNITY)
Admission: RE | Admit: 2017-11-17 | Discharge: 2017-11-17 | Disposition: A | Discharge status: Home / Self Care | Payer: Medicare HMO | Source: Ambulatory Visit | Attending: Urology | Admitting: Urology

## 2017-11-17 ENCOUNTER — Encounter (HOSPITAL_COMMUNITY): Payer: Self-pay

## 2017-11-17 DIAGNOSIS — C61 Malignant neoplasm of prostate: Secondary | ICD-10-CM | POA: Insufficient documentation

## 2017-11-17 MED ORDER — TECHNETIUM TC 99M MEDRONATE IV KIT
20.0000 | PACK | Freq: Once | INTRAVENOUS | Status: AC | PRN
  Administered 2017-11-17: 19.7 via INTRAVENOUS

## 2017-11-26 ENCOUNTER — Ambulatory Visit (HOSPITAL_COMMUNITY)
Admission: RE | Admit: 2017-11-26 | Discharge: 2017-11-26 | Disposition: A | Discharge status: Home / Self Care | Payer: Medicare HMO | Source: Ambulatory Visit | Attending: Urology | Admitting: Urology

## 2017-11-26 DIAGNOSIS — R590 Localized enlarged lymph nodes: Secondary | ICD-10-CM | POA: Insufficient documentation

## 2017-11-26 DIAGNOSIS — Z8781 Personal history of (healed) traumatic fracture: Secondary | ICD-10-CM | POA: Insufficient documentation

## 2017-11-26 DIAGNOSIS — N400 Enlarged prostate without lower urinary tract symptoms: Secondary | ICD-10-CM | POA: Diagnosis not present

## 2017-11-26 DIAGNOSIS — N329 Bladder disorder, unspecified: Secondary | ICD-10-CM | POA: Diagnosis not present

## 2017-11-26 DIAGNOSIS — I700 Atherosclerosis of aorta: Secondary | ICD-10-CM | POA: Diagnosis not present

## 2017-11-26 DIAGNOSIS — N323 Diverticulum of bladder: Secondary | ICD-10-CM | POA: Diagnosis not present

## 2017-11-26 DIAGNOSIS — C61 Malignant neoplasm of prostate: Secondary | ICD-10-CM | POA: Insufficient documentation

## 2017-11-26 DIAGNOSIS — K449 Diaphragmatic hernia without obstruction or gangrene: Secondary | ICD-10-CM | POA: Diagnosis not present

## 2017-11-26 DIAGNOSIS — E279 Disorder of adrenal gland, unspecified: Secondary | ICD-10-CM | POA: Insufficient documentation

## 2017-11-26 LAB — POCT I-STAT CREATININE: Creatinine, Ser: 1.4 mg/dL — ABNORMAL HIGH (ref 0.61–1.24)

## 2017-11-26 MED ORDER — IOPAMIDOL (ISOVUE-300) INJECTION 61%
100.0000 mL | Freq: Once | INTRAVENOUS | Status: AC | PRN
  Administered 2017-11-26: 100 mL via INTRAVENOUS

## 2017-11-27 ENCOUNTER — Ambulatory Visit: Payer: Medicare HMO | Admitting: Urology

## 2017-11-27 DIAGNOSIS — C61 Malignant neoplasm of prostate: Secondary | ICD-10-CM | POA: Diagnosis not present

## 2017-11-27 DIAGNOSIS — C775 Secondary and unspecified malignant neoplasm of intrapelvic lymph nodes: Secondary | ICD-10-CM

## 2017-11-27 DIAGNOSIS — N403 Nodular prostate with lower urinary tract symptoms: Secondary | ICD-10-CM | POA: Diagnosis not present

## 2017-11-30 ENCOUNTER — Telehealth: Payer: Self-pay | Admitting: Medical Oncology

## 2017-11-30 ENCOUNTER — Encounter: Payer: Self-pay | Admitting: Medical Oncology

## 2017-11-30 NOTE — Telephone Encounter (Signed)
I called pt to introduce myself as the Prostate Nurse Navigator and the Coordinator of the [facility] [place].  1. I confirmed with the patient he is aware of his referral to the clinic 11/08 arriving at 8:00 am.  2. I discussed the format of the clinic and the physicians he will be seeing that day.  3. I discussed where the clinic is located and how to contact me.  4. I confirmed his address and informed him I would be mailing a packet of information and forms to be completed. I asked him to bring them with him the day of his appointment.   He voiced understanding of the above. I asked him to call me if he has any questions or concerns regarding his appointments or the forms he needs to complete.

## 2017-12-03 ENCOUNTER — Telehealth: Payer: Self-pay | Admitting: Medical Oncology

## 2017-12-03 DIAGNOSIS — C61 Malignant neoplasm of prostate: Secondary | ICD-10-CM | POA: Insufficient documentation

## 2017-12-03 NOTE — Telephone Encounter (Signed)
Spoke with patient to remind him of appointment for [facility] 11/8 arriving at 8 am. I reviewed the location, valet parking, registration and reminded him to bring his completed medical forms. He voiced understanding.

## 2017-12-04 ENCOUNTER — Ambulatory Visit
Admission: RE | Admit: 2017-12-04 | Discharge: 2017-12-04 | Disposition: A | Discharge status: Home / Self Care | Payer: Medicare HMO | Source: Ambulatory Visit | Attending: Radiation Oncology | Admitting: Radiation Oncology

## 2017-12-04 ENCOUNTER — Other Ambulatory Visit: Payer: Self-pay

## 2017-12-04 ENCOUNTER — Encounter: Payer: Self-pay | Admitting: Radiation Oncology

## 2017-12-04 ENCOUNTER — Inpatient Hospital Stay: Payer: Medicare HMO | Attending: Oncology | Admitting: Oncology

## 2017-12-04 ENCOUNTER — Encounter: Payer: Self-pay | Admitting: Medical Oncology

## 2017-12-04 DIAGNOSIS — N401 Enlarged prostate with lower urinary tract symptoms: Secondary | ICD-10-CM | POA: Diagnosis not present

## 2017-12-04 DIAGNOSIS — R351 Nocturia: Secondary | ICD-10-CM | POA: Diagnosis not present

## 2017-12-04 DIAGNOSIS — R350 Frequency of micturition: Secondary | ICD-10-CM | POA: Diagnosis not present

## 2017-12-04 DIAGNOSIS — C61 Malignant neoplasm of prostate: Secondary | ICD-10-CM

## 2017-12-04 DIAGNOSIS — Z808 Family history of malignant neoplasm of other organs or systems: Secondary | ICD-10-CM

## 2017-12-04 DIAGNOSIS — C775 Secondary and unspecified malignant neoplasm of intrapelvic lymph nodes: Secondary | ICD-10-CM | POA: Diagnosis not present

## 2017-12-04 DIAGNOSIS — Z79899 Other long term (current) drug therapy: Secondary | ICD-10-CM | POA: Diagnosis not present

## 2017-12-04 DIAGNOSIS — R972 Elevated prostate specific antigen [PSA]: Secondary | ICD-10-CM | POA: Diagnosis not present

## 2017-12-04 HISTORY — DX: Malignant neoplasm of prostate: C61
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
## 2017-12-04 NOTE — Consult Note (Signed)
[facility]     12/04/2017   --------------------------------------------------------------------------------   Troy Walter  MRN: [id number]  PRIMARY CARE:  Troy Amel, MD  DOB: [birth year], 72 year old Male  REFERRING:  Troy Stcharles. Paulita Fujita, MD  SSN:   PROVIDER:  Irine Walter, M.D.    TREATING:  Troy Walter, M.D.    LOCATION:  [facility] [phone]   --------------------------------------------------------------------------------   CC/HPI: CC: Prostate Cancer   Physician requesting consult: Dr. Irine Walter  PCP: Dr. Lujean Walter  Location of consult: [facility] - Prostate Cancer Multidisciplinary Clinic   Troy Walter is a 72 year old very healthy gentleman who was noted to have an elevated PSA of 13.5 and induration of the prostate with an exam suspicious for locally advanced prostate cancer. He underwent a TRUS biopsy of the prostate on 10/30/72 that demonstrated Gleason 4+5=9 adenocarcinoma with 12 out of 12 biopsies positive for malignancy.   Family history: None.   Imaging studies:  Bone scan (11/17/17): Uptake of tracer in the right proximal/mid femur - question trauma/prior surgery vs metastatic disease  CT abdomen/pelvis (11/26/17): 2.7 cm right adrenal mass (stable since 2016), 1.2 cm and 0.9 cm right external iliac lymph nodes   PMH: He no medical comorbidities.  PSH: Hernia repair   TNM stage: cT3a N1 M0-1b  PSA: 13.5  Gleason score: 4+5=9  Biopsy (10/30/17): 12/12 cores positive  Left: L lateral apex (80%, 4+4=8, PNI), L apex (5%, 3+4=7), L lateral mid (90%, 4+5=9), L mid (90%, 4+5=9, PNI), L lateral base (95%, 4+4=8), L base (90%, 4+4=8, PNI)  Right: R apex (70%, 4+5=9), R lateral apex (60%, 4+3=7), R mid (90%, 4+5=9, PNI), R lateral mid (70%, 4+5=9), R base (80%, 4+5=9), R lateral base (95%, 4+5=9)  Prostate volume: 42 cc   Nomogram  OC disease: 7%  EPE: 91%  SVI: 58%  LNI: 52%  PFS (5 year, 10 year): 11%, 6%    Urinary function: IPSS is 21. He take tamsulosin.  Erectile function: SHIM score is . He does have erectile dysfunction.     ALLERGIES: None   MEDICATIONS: Tamsulosin HCL PO Daily     GU PSH: Prostate Needle Biopsy - 10/30/2017    NON-GU PSH: Cataract surgery Hernia Repair - 2015 Surgical Pathology, Gross And Microscopic Examination For Prostate Needle - 10/30/2017    GU PMH: Metastatic pelvic lymphadenopathy - 11/27/2017 Prostate Cancer, High risk T3 N1 M0 Gleason 9 prostate cancer with severe LUTS. He is a candidate for ADT alone or in combination with surgical therapy or radiation therapy. He is a potential candidate for the Proteus trial with neoadjuvant and adjuvent ADT +/- Apalutamide and radical prostatectomy. He should also be considered for genetic counseling with his disease state. I will see about getting him set up for the [facility] and have given him the prostate cancer books along with his path and staging reports for review. - 11/27/2017 Elevated PSA - 10/30/2017, He has had a dramatic increase in the PSA over the last 4 years and his exam is consistent with a T3 prostate cancer. I am going to get him set up for a prostate [place] and biopsy. I reviewed the risks of bleeding, infection and voiding difficulty. Levaquin sent for the prep. I will repeat his PSA and testosterone today. , - 10/16/2017 Nocturia - 10/16/2017 Prostate nodule w/ LUTS, I have encouraged him to begin the tamsulosin. He has a mildly elevated PVR. - 10/16/2017    NON-GU  PMH: Glaucoma    FAMILY HISTORY: Heart Attack - Father heart failure - Mother   SOCIAL HISTORY: Marital Status: Married Preferred Language: English; Race: Black or African American Current Smoking Status: Patient does not smoke anymore. Has not smoked since 09/27/1977. Smoked for 10 years. Smoked less than 1/2 pack per day.   Tobacco Use Assessment Completed: Used Tobacco in last 30 days? Drinks 2 caffeinated drinks per day. 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    REVIEW OF SYSTEMS:    GU Review Male:   Patient denies frequent urination, hard to postpone urination, burning/ pain with urination, get up at night to urinate, leakage of urine, stream starts and stops, trouble starting your streams, and have to strain to urinate .  Gastrointestinal (Upper):   Patient denies nausea and vomiting.  Gastrointestinal (Lower):   Patient denies diarrhea and constipation.  Constitutional:   Patient denies fever, night sweats, weight loss, and fatigue.  Skin:   Patient denies skin rash/ lesion and itching.  Eyes:   Patient denies double vision and blurred vision.  Ears/ Nose/ Throat:   Patient denies sore throat and sinus problems.  Hematologic/Lymphatic:   Patient denies swollen glands and easy bruising.  Cardiovascular:   Patient denies leg swelling and chest pains.  Respiratory:   Patient denies cough and shortness of breath.  Endocrine:   Patient denies excessive thirst.  Musculoskeletal:   Patient denies back pain and joint pain.  Neurological:   Patient denies headaches and dizziness.  Psychologic:   Patient denies depression and anxiety.   VITAL SIGNS: None   MULTI-SYSTEM PHYSICAL EXAMINATION:    Constitutional: Well-nourished. No physical deformities. Normally developed. Good grooming.     PAST DATA REVIEWED:  Source Of History:  Patient  Lab Test Review:   PSA  Records Review:   Pathology Reports, Previous Patient Records  X-Ray Review: C.T. Abdomen/Pelvis: Reviewed Films.  Bone Scan: Reviewed Films.     10/16/17 08/20/17 06/20/13 08/18/12  PSA  Total PSA 13.5 ng/dl 11.93 ng/dl 0.6 ng/dl 0.54 ng/dl    10/16/17  Hormones  Testosterone, Total 492 pg/dL    PROCEDURES: None   ASSESSMENT:      ICD-10 Details  1 GU:   Prostate Cancer - C61   2   Metastatic pelvic lymphadenopathy - C77.5    PLAN:           Document Letter(s):  Created for Patient: Clinical Summary         Notes:   1. High-risk prostate  cancer with probable lymph node involvement: I had a detailed discussion with Troy Walter and his brother today regarding his treatment options for his high-risk prostate cancer. The patient was counseled about the natural history of prostate cancer and the standard treatment options that are available for prostate cancer. It was explained to him how his age and life expectancy, clinical stage, Gleason score, and PSA affect his prognosis, the decision to proceed with additional staging studies, as well as how that information influences recommended treatment strategies. We discussed the roles for active surveillance, radiation therapy, surgical therapy, androgen deprivation, as well as ablative therapy options for the treatment of prostate cancer as appropriate to his individual cancer situation. We discussed the risks and benefits of these options with regard to their impact on cancer control and also in terms of potential adverse events, complications, and impact on quality of life particularly related to urinary and sexual function. The patient was encouraged to ask questions throughout  the discussion today and all questions were answered to his stated satisfaction. In addition, the patient was provided with and/or directed to appropriate resources and literature for further education about prostate cancer and treatment options.   We focused our discussion on primary surgical therapy in the context of multimodality therapy versus primary radiation therapy with long-term androgen deprivation. We discussed the pros and cons of each of these approaches in detail. We also discussed his baseline voiding symptoms which are significant. He understands that these potentially could be improved with surgical therapy as his symptoms may be related to locally advanced cancer or BPH. He understands that if his symptoms are related to locally advanced cancer, that likely would improve with androgen deprivation as well. We  also discussed the option of proceeding with surgical therapy in the context of a clinical trial and I did discuss the PROTEUS trial with him today.   Ultimately, he would like to avoid surgical therapy and is inclined to proceed with long-term androgen deprivation and external beam radiation therapy. I will relay this information to his primary urologist, Dr. Jeffie Pollock.   CC: Dr. Macario Golds  Dr. Tyler Pita  Dr. Zola Button    E & M CODE: I spent at least 48 minutes face to face with the patient, more than 50% of that time was spent on counseling and/or coordinating care.

## 2017-12-04 NOTE — Progress Notes (Signed)
                               Care Plan Summary  Name: Kameren Pargas. Robarge DOB: September 12, 1945   Your Medical Team:   Urologist -  Dr. Raynelle Bring, [facility]  Radiation Oncologist - Dr. Tyler Pita, [facility]   Medical Oncologist - Dr. Zola Button, [place]  Recommendations: 1)   Androgen deprivation (hormone injections)  2) Radiation   * These recommendations are based on information available as of today's consult.      Recommendations may change depending on the results of further tests or exams.  Next Steps: 1) Consider your options and call Robin with decision  When appointments need to be scheduled, you will be contacted by [facility] and/or [facility].  Patient was provided with business cards for all team members and a copy of "Fall Prevention Patient Safety Plan". Questions?  Please do not hesitate to call Cira Rue, RN, BSN, OCN at [phone] any questions or concerns.  Shirlean Mylar is your Oncology Nurse Navigator and is available to assist you while you're receiving your medical care at [facility].

## 2017-12-04 NOTE — Progress Notes (Signed)
GU Location of Tumor / Histology: prostatic adenocarcinoma with suspicious right external iliac nodes  If Prostate Cancer, Gleason Score is (4 + 5) and PSA is (13.5). Prostate volume: 42 grams.   Troy Walter was referred by Dr. Arta Silence to Dr. Jeffie Pollock in September 2019 for further evaluation of his elevated PSA.  Biopsies of prostate (if applicable) revealed:    Past/Anticipated interventions by urology, if any: prostate biopsy, bone scan, referral to [facility]  Past/Anticipated interventions by medical oncology, if any: no  Weight changes, if any: no  Bowel/Bladder complaints, if any: IPSS 22. SHIM 1.    Nausea/Vomiting, if any: no  Pain issues, if any:  no  SAFETY ISSUES:  Prior radiation? no  Pacemaker/ICD? no  Possible current pregnancy? no  Is the patient on methotrexate? no  Current Complaints / other details:  72 year old male. Married. Retired Administrator.
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
## 2017-12-07 ENCOUNTER — Telehealth: Payer: Self-pay

## 2017-12-07 ENCOUNTER — Telehealth: Payer: Self-pay | Admitting: Medical Oncology

## 2017-12-07 NOTE — Telephone Encounter (Signed)
Per 1/18 no los °

## 2017-12-07 NOTE — Telephone Encounter (Signed)
Troy Walter called and has decided on ADT and radiation to treat his prostate cancer. I informed him he will get a call from Dr. Ralene Muskrat office to schedule the shot and then Dr. Johny Shears office will schedule radiation. He voiced understanding. I forward his decision to Dr. Tammi Klippel.

## 2017-12-14 ENCOUNTER — Telehealth: Payer: Self-pay | Admitting: Medical Oncology

## 2017-12-14 ENCOUNTER — Encounter: Payer: Self-pay | Admitting: General Practice

## 2017-12-14 NOTE — Telephone Encounter (Signed)
Spoke with Mr. Hinderer to see if he has been contacted by Dr. Ralene Muskrat office about appointment for ADT. He has not been contacted. I will follow up and call him back. He voiced understanding.

## 2017-12-14 NOTE — Progress Notes (Signed)
Troy Walter presented to [facility], where he received full packet of [facility] team/programming information. Repeat attempts to reach him by phone (to introduce [facility] team/resources and further assess for distress and other psychosocial needs) have been unsuccessful.  The patient scored a 2 on the Psychosocial Distress Thermometer which indicates mild distress.   ONCBCN DISTRESS SCREENING 12/14/2017  Screening Type Initial Screening  Distress experienced in past week (1-10) 2  Practical problem type Transportation  Emotional problem type Adjusting to illness  Referral to support programs Yes    Follow up needed: Yes.  Representative from Support Team will continue to try to reach pt by phone (or postal mail if needed) per transportation concerns.   [place], [place], [facility] Pager [phone] Voicemail [phone]

## 2017-12-14 NOTE — Telephone Encounter (Signed)
Called Troy Walter to inform him I spoke with Rebekah Chesterfield at [facility]. She informed me that Dr. Ralene Muskrat has messaged nurse in the [place] office to get him scheduled for ADT ASAP. I asked him to call me back if he has not heard from them in 2 days. He voiced understanding.

## 2017-12-17 ENCOUNTER — Encounter: Payer: Self-pay | Admitting: General Practice

## 2017-12-17 NOTE — Progress Notes (Signed)
[facility] Spiritual Care Note  Reached Troy Walter by phone to f/u from [facility] and distress screen. Per pt, he is coping well, "feeling like everything is pretty normal," and eager to get treatment started. Provided pastoral listening, normalizing feelings. [place] available on [employer], noting that there are some programs at [facility] in [place], as well. Per pt, transportation is an inconvenience because he depends on his wife to drive him, and their schedules don't always align, but not a barrier to treatment. Per pt, no other needs at this time, but he knows to contact Team as needs or questions arise.   [place], [place], [facility] Pager [phone] Voicemail [phone]

## 2017-12-18 ENCOUNTER — Ambulatory Visit (INDEPENDENT_AMBULATORY_CARE_PROVIDER_SITE_OTHER): Payer: Medicare HMO | Admitting: Urology

## 2017-12-18 DIAGNOSIS — C61 Malignant neoplasm of prostate: Secondary | ICD-10-CM

## 2017-12-18 DIAGNOSIS — C775 Secondary and unspecified malignant neoplasm of intrapelvic lymph nodes: Secondary | ICD-10-CM | POA: Diagnosis not present

## 2017-12-31 ENCOUNTER — Encounter: Payer: Self-pay | Admitting: Medical Oncology

## 2018-01-22 ENCOUNTER — Other Ambulatory Visit: Payer: Self-pay | Admitting: Urology

## 2018-01-22 ENCOUNTER — Telehealth: Payer: Self-pay | Admitting: *Deleted

## 2018-01-22 DIAGNOSIS — C61 Malignant neoplasm of prostate: Secondary | ICD-10-CM

## 2018-01-22 NOTE — Telephone Encounter (Signed)
CALLED PATIENT TO INFORM OF FID. MARKERS AND SPACE OAR PLACEMENT ON 02-18-18 @ [facility] AND HIS SIM ON 02-25-18 @ DR. MANNING'S OFFICE, SPOKE WITH PATIENT AND HE IS AWARE OF THESE APPTS.

## 2018-01-25 ENCOUNTER — Ambulatory Visit: Payer: Medicare HMO | Admitting: Radiation Oncology

## 2018-01-29 ENCOUNTER — Ambulatory Visit: Payer: Medicare HMO | Admitting: Urology

## 2018-01-29 DIAGNOSIS — R972 Elevated prostate specific antigen [PSA]: Secondary | ICD-10-CM

## 2018-01-29 DIAGNOSIS — C61 Malignant neoplasm of prostate: Secondary | ICD-10-CM | POA: Diagnosis not present

## 2018-01-29 DIAGNOSIS — N403 Nodular prostate with lower urinary tract symptoms: Secondary | ICD-10-CM | POA: Diagnosis not present

## 2018-01-29 DIAGNOSIS — R351 Nocturia: Secondary | ICD-10-CM | POA: Diagnosis not present

## 2018-02-18 DIAGNOSIS — C61 Malignant neoplasm of prostate: Secondary | ICD-10-CM | POA: Diagnosis not present

## 2018-02-19 ENCOUNTER — Telehealth: Payer: Self-pay | Admitting: *Deleted

## 2018-02-19 NOTE — Telephone Encounter (Signed)
CALLED PATIENT TO INFORM OF SIM APPT. FOR 02-25-18 - ARRIVAL TIME - 8:45 AM @ [facility] ON 02-25-18 - ARRIVAL TIME - 11:30 AM @ [facility], NO RESTRICTIONS TO TEST, SPOKE WITH PATIENT AND HE IS AWARE OF THESE APPTS.

## 2018-02-23 ENCOUNTER — Encounter: Payer: Self-pay | Admitting: *Deleted

## 2018-02-24 ENCOUNTER — Telehealth: Payer: Self-pay | Admitting: *Deleted

## 2018-02-24 NOTE — Telephone Encounter (Signed)
Called patient to remind of CT Sim for 02-25-18 - arrival time- 8:45 am , and MRI for 02/25/18, spoke with patient's wife- Butch Penny and she is aware of these appts.

## 2018-02-25 ENCOUNTER — Ambulatory Visit (HOSPITAL_COMMUNITY)
Admission: RE | Admit: 2018-02-25 | Discharge: 2018-02-25 | Disposition: A | Discharge status: Home / Self Care | Payer: Medicare HMO | Source: Ambulatory Visit | Attending: Urology | Admitting: Urology

## 2018-02-25 ENCOUNTER — Ambulatory Visit
Admission: RE | Admit: 2018-02-25 | Discharge: 2018-02-25 | Disposition: A | Discharge status: Home / Self Care | Payer: Medicare HMO | Source: Ambulatory Visit | Attending: Radiation Oncology | Admitting: Radiation Oncology

## 2018-02-25 DIAGNOSIS — Z79899 Other long term (current) drug therapy: Secondary | ICD-10-CM | POA: Insufficient documentation

## 2018-02-25 DIAGNOSIS — C61 Malignant neoplasm of prostate: Secondary | ICD-10-CM | POA: Diagnosis not present

## 2018-02-25 NOTE — Progress Notes (Signed)
  Radiation Oncology         [phone]) [phone] ________________________________  Name: Troy Walter MRN: [id number]  Date: 02/25/2018  DOB: 30-Nov-1945  SIMULATION AND TREATMENT PLANNING NOTE    ICD-10-CM   1. Malignant neoplasm of prostate ([place]) C61     DIAGNOSIS:  73 y.o. gentleman with stage T3N1 adenocarcinoma of the prostate with a Gleason's score of 4+5 and a PSA of 13.5  NARRATIVE:  The patient was brought to the [facility].  Identity was confirmed.  All relevant records and images related to the planned course of therapy were reviewed.  The patient freely provided informed written consent to proceed with treatment after reviewing the details related to the planned course of therapy. The consent form was witnessed and verified by the simulation staff.  Then, the patient was set-up in a stable reproducible supine position for radiation therapy.  A vacuum lock pillow device was custom fabricated to position his legs in a reproducible immobilized position.  CT images were obtained.  Surface markings were placed.  The CT images were loaded into the planning software.  Then the prostate target and avoidance structures including the rectum, bladder, bowel and hips were contoured.  Treatment planning then occurred.  The radiation prescription was entered and confirmed.  A total of one complex treatment devices was fabricated. I have requested : Intensity Modulated Radiotherapy (IMRT) is medically necessary for this case for the following reason:  Rectal sparing.Marland Kitchen  PLAN:  The patient will receive 75 Gy in 40 fractions.  The prostate, seminal vesicles, and pelvic lymph nodes will initially be treated to 45 Gy in 25 fractions of 1.8 Gy followed by a boost, to the prostate and enlarged nodes only, to 75 Gy with 15 additional fractions of 2.0 Gy  ________________________________  Sheral Apley Tammi Klippel, M.D.

## 2018-03-08 DIAGNOSIS — C61 Malignant neoplasm of prostate: Secondary | ICD-10-CM | POA: Diagnosis not present

## 2018-03-08 DIAGNOSIS — Z79899 Other long term (current) drug therapy: Secondary | ICD-10-CM | POA: Diagnosis not present

## 2018-03-09 ENCOUNTER — Ambulatory Visit
Admission: RE | Admit: 2018-03-09 | Discharge: 2018-03-09 | Disposition: A | Discharge status: Home / Self Care | Payer: Medicare HMO | Source: Ambulatory Visit | Attending: Radiation Oncology | Admitting: Radiation Oncology

## 2018-03-09 ENCOUNTER — Encounter: Payer: Self-pay | Admitting: Medical Oncology

## 2018-03-09 DIAGNOSIS — C61 Malignant neoplasm of prostate: Secondary | ICD-10-CM | POA: Diagnosis not present

## 2018-03-09 DIAGNOSIS — Z79899 Other long term (current) drug therapy: Secondary | ICD-10-CM | POA: Diagnosis not present

## 2018-03-10 ENCOUNTER — Ambulatory Visit
Admission: RE | Admit: 2018-03-10 | Discharge: 2018-03-10 | Disposition: A | Discharge status: Home / Self Care | Payer: Medicare HMO | Source: Ambulatory Visit | Attending: Radiation Oncology | Admitting: Radiation Oncology

## 2018-03-10 DIAGNOSIS — C61 Malignant neoplasm of prostate: Secondary | ICD-10-CM | POA: Diagnosis not present

## 2018-03-10 DIAGNOSIS — Z79899 Other long term (current) drug therapy: Secondary | ICD-10-CM | POA: Diagnosis not present

## 2018-03-11 ENCOUNTER — Ambulatory Visit: Payer: Medicare HMO

## 2018-03-12 ENCOUNTER — Ambulatory Visit
Admission: RE | Admit: 2018-03-12 | Discharge: 2018-03-12 | Disposition: A | Discharge status: Home / Self Care | Payer: Medicare HMO | Source: Ambulatory Visit | Attending: Radiation Oncology | Admitting: Radiation Oncology

## 2018-03-12 DIAGNOSIS — C61 Malignant neoplasm of prostate: Secondary | ICD-10-CM | POA: Diagnosis not present

## 2018-03-12 DIAGNOSIS — Z79899 Other long term (current) drug therapy: Secondary | ICD-10-CM | POA: Diagnosis not present

## 2018-03-12 NOTE — Progress Notes (Signed)
Pt here for patient teaching.  Pt given Radiation and You booklet.  Reviewed areas of pertinence such as diarrhea, fatigue and urinary and bladder changes . Pt able to give teach back of use baby wipes, have Imodium on hand, drink plenty of water and sitz bath, Pt demonstrated understanding, of information given and will contact nursing with any questions or concerns.     [email or web address]

## 2018-03-15 ENCOUNTER — Ambulatory Visit
Admission: RE | Admit: 2018-03-15 | Discharge: 2018-03-15 | Disposition: A | Discharge status: Home / Self Care | Payer: Medicare HMO | Source: Ambulatory Visit | Attending: Radiation Oncology | Admitting: Radiation Oncology

## 2018-03-15 DIAGNOSIS — Z79899 Other long term (current) drug therapy: Secondary | ICD-10-CM | POA: Diagnosis not present

## 2018-03-15 DIAGNOSIS — C61 Malignant neoplasm of prostate: Secondary | ICD-10-CM | POA: Diagnosis not present

## 2018-03-16 ENCOUNTER — Ambulatory Visit
Admission: RE | Admit: 2018-03-16 | Discharge: 2018-03-16 | Disposition: A | Discharge status: Home / Self Care | Payer: Medicare HMO | Source: Ambulatory Visit | Attending: Radiation Oncology | Admitting: Radiation Oncology

## 2018-03-16 DIAGNOSIS — Z79899 Other long term (current) drug therapy: Secondary | ICD-10-CM | POA: Diagnosis not present

## 2018-03-16 DIAGNOSIS — C61 Malignant neoplasm of prostate: Secondary | ICD-10-CM | POA: Diagnosis not present

## 2018-03-17 ENCOUNTER — Ambulatory Visit
Admission: RE | Admit: 2018-03-17 | Discharge: 2018-03-17 | Disposition: A | Discharge status: Home / Self Care | Payer: Medicare HMO | Source: Ambulatory Visit | Attending: Radiation Oncology | Admitting: Radiation Oncology

## 2018-03-17 DIAGNOSIS — C61 Malignant neoplasm of prostate: Secondary | ICD-10-CM | POA: Diagnosis not present

## 2018-03-17 DIAGNOSIS — Z79899 Other long term (current) drug therapy: Secondary | ICD-10-CM | POA: Diagnosis not present

## 2018-03-18 ENCOUNTER — Ambulatory Visit
Admission: RE | Admit: 2018-03-18 | Discharge: 2018-03-18 | Disposition: A | Discharge status: Home / Self Care | Payer: Medicare HMO | Source: Ambulatory Visit | Attending: Radiation Oncology | Admitting: Radiation Oncology

## 2018-03-18 DIAGNOSIS — Z79899 Other long term (current) drug therapy: Secondary | ICD-10-CM | POA: Diagnosis not present

## 2018-03-18 DIAGNOSIS — C61 Malignant neoplasm of prostate: Secondary | ICD-10-CM | POA: Diagnosis not present

## 2018-03-19 ENCOUNTER — Ambulatory Visit
Admission: RE | Admit: 2018-03-19 | Discharge: 2018-03-19 | Disposition: A | Discharge status: Home / Self Care | Payer: Medicare HMO | Source: Ambulatory Visit | Attending: Radiation Oncology | Admitting: Radiation Oncology

## 2018-03-19 DIAGNOSIS — C61 Malignant neoplasm of prostate: Secondary | ICD-10-CM | POA: Diagnosis not present

## 2018-03-19 DIAGNOSIS — Z79899 Other long term (current) drug therapy: Secondary | ICD-10-CM | POA: Diagnosis not present

## 2018-03-22 ENCOUNTER — Ambulatory Visit
Admission: RE | Admit: 2018-03-22 | Discharge: 2018-03-22 | Disposition: A | Discharge status: Home / Self Care | Payer: Medicare HMO | Source: Ambulatory Visit | Attending: Radiation Oncology | Admitting: Radiation Oncology

## 2018-03-22 DIAGNOSIS — C61 Malignant neoplasm of prostate: Secondary | ICD-10-CM | POA: Diagnosis not present

## 2018-03-22 DIAGNOSIS — Z79899 Other long term (current) drug therapy: Secondary | ICD-10-CM | POA: Diagnosis not present

## 2018-03-23 ENCOUNTER — Ambulatory Visit
Admission: RE | Admit: 2018-03-23 | Discharge: 2018-03-23 | Disposition: A | Discharge status: Home / Self Care | Payer: Medicare HMO | Source: Ambulatory Visit | Attending: Radiation Oncology | Admitting: Radiation Oncology

## 2018-03-23 DIAGNOSIS — C61 Malignant neoplasm of prostate: Secondary | ICD-10-CM | POA: Diagnosis not present

## 2018-03-23 DIAGNOSIS — Z79899 Other long term (current) drug therapy: Secondary | ICD-10-CM | POA: Diagnosis not present

## 2018-03-24 ENCOUNTER — Ambulatory Visit
Admission: RE | Admit: 2018-03-24 | Discharge: 2018-03-24 | Disposition: A | Discharge status: Home / Self Care | Payer: Medicare HMO | Source: Ambulatory Visit | Attending: Radiation Oncology | Admitting: Radiation Oncology

## 2018-03-24 DIAGNOSIS — C61 Malignant neoplasm of prostate: Secondary | ICD-10-CM | POA: Diagnosis not present

## 2018-03-24 DIAGNOSIS — Z79899 Other long term (current) drug therapy: Secondary | ICD-10-CM | POA: Diagnosis not present

## 2018-03-25 ENCOUNTER — Ambulatory Visit
Admission: RE | Admit: 2018-03-25 | Discharge: 2018-03-25 | Disposition: A | Discharge status: Home / Self Care | Payer: Medicare HMO | Source: Ambulatory Visit | Attending: Radiation Oncology | Admitting: Radiation Oncology

## 2018-03-25 DIAGNOSIS — C61 Malignant neoplasm of prostate: Secondary | ICD-10-CM | POA: Diagnosis not present

## 2018-03-25 DIAGNOSIS — Z79899 Other long term (current) drug therapy: Secondary | ICD-10-CM | POA: Diagnosis not present

## 2018-03-26 ENCOUNTER — Ambulatory Visit
Admission: RE | Admit: 2018-03-26 | Discharge: 2018-03-26 | Disposition: A | Discharge status: Home / Self Care | Payer: Medicare HMO | Source: Ambulatory Visit | Attending: Radiation Oncology | Admitting: Radiation Oncology

## 2018-03-26 DIAGNOSIS — Z79899 Other long term (current) drug therapy: Secondary | ICD-10-CM | POA: Diagnosis not present

## 2018-03-26 DIAGNOSIS — C61 Malignant neoplasm of prostate: Secondary | ICD-10-CM | POA: Diagnosis not present

## 2018-03-29 ENCOUNTER — Ambulatory Visit
Admission: RE | Admit: 2018-03-29 | Discharge: 2018-03-29 | Disposition: A | Discharge status: Home / Self Care | Payer: Medicare Other | Source: Ambulatory Visit | Attending: Radiation Oncology | Admitting: Radiation Oncology

## 2018-03-29 DIAGNOSIS — Z79899 Other long term (current) drug therapy: Secondary | ICD-10-CM | POA: Insufficient documentation

## 2018-03-29 DIAGNOSIS — C61 Malignant neoplasm of prostate: Secondary | ICD-10-CM | POA: Diagnosis not present

## 2018-03-30 ENCOUNTER — Ambulatory Visit
Admission: RE | Admit: 2018-03-30 | Discharge: 2018-03-30 | Disposition: A | Discharge status: Home / Self Care | Payer: Medicare Other | Source: Ambulatory Visit | Attending: Radiation Oncology | Admitting: Radiation Oncology

## 2018-03-30 DIAGNOSIS — C61 Malignant neoplasm of prostate: Secondary | ICD-10-CM | POA: Diagnosis not present

## 2018-03-31 ENCOUNTER — Ambulatory Visit
Admission: RE | Admit: 2018-03-31 | Discharge: 2018-03-31 | Disposition: A | Discharge status: Home / Self Care | Payer: Medicare Other | Source: Ambulatory Visit | Attending: Radiation Oncology | Admitting: Radiation Oncology

## 2018-03-31 DIAGNOSIS — C61 Malignant neoplasm of prostate: Secondary | ICD-10-CM | POA: Diagnosis not present

## 2018-04-01 ENCOUNTER — Ambulatory Visit
Admission: RE | Admit: 2018-04-01 | Discharge: 2018-04-01 | Disposition: A | Discharge status: Home / Self Care | Payer: Medicare Other | Source: Ambulatory Visit | Attending: Radiation Oncology | Admitting: Radiation Oncology

## 2018-04-01 DIAGNOSIS — C61 Malignant neoplasm of prostate: Secondary | ICD-10-CM | POA: Diagnosis not present

## 2018-04-02 ENCOUNTER — Ambulatory Visit
Admission: RE | Admit: 2018-04-02 | Discharge: 2018-04-02 | Disposition: A | Discharge status: Home / Self Care | Payer: Medicare Other | Source: Ambulatory Visit | Attending: Radiation Oncology | Admitting: Radiation Oncology

## 2018-04-02 DIAGNOSIS — C61 Malignant neoplasm of prostate: Secondary | ICD-10-CM | POA: Diagnosis not present

## 2018-04-05 ENCOUNTER — Ambulatory Visit
Admission: RE | Admit: 2018-04-05 | Discharge: 2018-04-05 | Disposition: A | Discharge status: Home / Self Care | Payer: Medicare Other | Source: Ambulatory Visit | Attending: Radiation Oncology | Admitting: Radiation Oncology

## 2018-04-05 DIAGNOSIS — C61 Malignant neoplasm of prostate: Secondary | ICD-10-CM | POA: Diagnosis not present

## 2018-04-06 ENCOUNTER — Ambulatory Visit
Admission: RE | Admit: 2018-04-06 | Discharge: 2018-04-06 | Disposition: A | Discharge status: Home / Self Care | Payer: Medicare Other | Source: Ambulatory Visit | Attending: Radiation Oncology | Admitting: Radiation Oncology

## 2018-04-06 DIAGNOSIS — C61 Malignant neoplasm of prostate: Secondary | ICD-10-CM | POA: Diagnosis not present

## 2018-04-07 ENCOUNTER — Ambulatory Visit
Admission: RE | Admit: 2018-04-07 | Discharge: 2018-04-07 | Disposition: A | Discharge status: Home / Self Care | Payer: Medicare Other | Source: Ambulatory Visit | Attending: Radiation Oncology | Admitting: Radiation Oncology

## 2018-04-07 DIAGNOSIS — C61 Malignant neoplasm of prostate: Secondary | ICD-10-CM | POA: Diagnosis not present

## 2018-04-08 ENCOUNTER — Ambulatory Visit
Admission: RE | Admit: 2018-04-08 | Discharge: 2018-04-08 | Disposition: A | Discharge status: Home / Self Care | Payer: Medicare Other | Source: Ambulatory Visit | Attending: Radiation Oncology | Admitting: Radiation Oncology

## 2018-04-08 DIAGNOSIS — C61 Malignant neoplasm of prostate: Secondary | ICD-10-CM | POA: Diagnosis not present

## 2018-04-09 ENCOUNTER — Ambulatory Visit
Admission: RE | Admit: 2018-04-09 | Discharge: 2018-04-09 | Disposition: A | Discharge status: Home / Self Care | Payer: Medicare Other | Source: Ambulatory Visit | Attending: Radiation Oncology | Admitting: Radiation Oncology

## 2018-04-09 DIAGNOSIS — C61 Malignant neoplasm of prostate: Secondary | ICD-10-CM | POA: Diagnosis not present

## 2018-04-12 ENCOUNTER — Other Ambulatory Visit: Payer: Self-pay

## 2018-04-12 ENCOUNTER — Ambulatory Visit
Admission: RE | Admit: 2018-04-12 | Discharge: 2018-04-12 | Disposition: A | Discharge status: Home / Self Care | Payer: Medicare Other | Source: Ambulatory Visit | Attending: Radiation Oncology | Admitting: Radiation Oncology

## 2018-04-12 DIAGNOSIS — C61 Malignant neoplasm of prostate: Secondary | ICD-10-CM | POA: Diagnosis not present

## 2018-04-13 ENCOUNTER — Ambulatory Visit: Payer: Medicare Other

## 2018-04-13 ENCOUNTER — Other Ambulatory Visit: Payer: Self-pay

## 2018-04-13 ENCOUNTER — Ambulatory Visit
Admission: RE | Admit: 2018-04-13 | Discharge: 2018-04-13 | Disposition: A | Discharge status: Home / Self Care | Payer: Medicare Other | Source: Ambulatory Visit | Attending: Radiation Oncology | Admitting: Radiation Oncology

## 2018-04-13 DIAGNOSIS — C61 Malignant neoplasm of prostate: Secondary | ICD-10-CM | POA: Diagnosis not present

## 2018-04-14 ENCOUNTER — Ambulatory Visit: Payer: Medicare Other

## 2018-04-14 ENCOUNTER — Ambulatory Visit
Admission: RE | Admit: 2018-04-14 | Discharge: 2018-04-14 | Disposition: A | Discharge status: Home / Self Care | Payer: Medicare Other | Source: Ambulatory Visit | Attending: Radiation Oncology | Admitting: Radiation Oncology

## 2018-04-14 DIAGNOSIS — C61 Malignant neoplasm of prostate: Secondary | ICD-10-CM | POA: Diagnosis not present

## 2018-04-15 ENCOUNTER — Ambulatory Visit
Admission: RE | Admit: 2018-04-15 | Discharge: 2018-04-15 | Disposition: A | Discharge status: Home / Self Care | Payer: Medicare Other | Source: Ambulatory Visit | Attending: Radiation Oncology | Admitting: Radiation Oncology

## 2018-04-15 ENCOUNTER — Other Ambulatory Visit: Payer: Self-pay

## 2018-04-15 DIAGNOSIS — C61 Malignant neoplasm of prostate: Secondary | ICD-10-CM | POA: Diagnosis not present

## 2018-04-16 ENCOUNTER — Other Ambulatory Visit: Payer: Self-pay

## 2018-04-16 ENCOUNTER — Ambulatory Visit
Admission: RE | Admit: 2018-04-16 | Discharge: 2018-04-16 | Disposition: A | Discharge status: Home / Self Care | Payer: Medicare Other | Source: Ambulatory Visit | Attending: Radiation Oncology | Admitting: Radiation Oncology

## 2018-04-16 DIAGNOSIS — C61 Malignant neoplasm of prostate: Secondary | ICD-10-CM | POA: Diagnosis not present

## 2018-04-19 ENCOUNTER — Ambulatory Visit
Admission: RE | Admit: 2018-04-19 | Discharge: 2018-04-19 | Disposition: A | Discharge status: Home / Self Care | Payer: Medicare Other | Source: Ambulatory Visit | Attending: Radiation Oncology | Admitting: Radiation Oncology

## 2018-04-19 ENCOUNTER — Other Ambulatory Visit: Payer: Self-pay

## 2018-04-19 DIAGNOSIS — C61 Malignant neoplasm of prostate: Secondary | ICD-10-CM | POA: Diagnosis not present

## 2018-04-20 ENCOUNTER — Ambulatory Visit
Admission: RE | Admit: 2018-04-20 | Discharge: 2018-04-20 | Disposition: A | Discharge status: Home / Self Care | Payer: Medicare Other | Source: Ambulatory Visit | Attending: Radiation Oncology | Admitting: Radiation Oncology

## 2018-04-20 ENCOUNTER — Other Ambulatory Visit: Payer: Self-pay

## 2018-04-20 DIAGNOSIS — C61 Malignant neoplasm of prostate: Secondary | ICD-10-CM | POA: Diagnosis not present

## 2018-04-21 ENCOUNTER — Ambulatory Visit
Admission: RE | Admit: 2018-04-21 | Discharge: 2018-04-21 | Disposition: A | Discharge status: Home / Self Care | Payer: Medicare Other | Source: Ambulatory Visit | Attending: Radiation Oncology | Admitting: Radiation Oncology

## 2018-04-21 ENCOUNTER — Other Ambulatory Visit: Payer: Self-pay

## 2018-04-21 DIAGNOSIS — C61 Malignant neoplasm of prostate: Secondary | ICD-10-CM | POA: Diagnosis not present

## 2018-04-22 ENCOUNTER — Ambulatory Visit
Admission: RE | Admit: 2018-04-22 | Discharge: 2018-04-22 | Disposition: A | Discharge status: Home / Self Care | Payer: Medicare Other | Source: Ambulatory Visit | Attending: Radiation Oncology | Admitting: Radiation Oncology

## 2018-04-22 ENCOUNTER — Other Ambulatory Visit: Payer: Self-pay

## 2018-04-22 DIAGNOSIS — C61 Malignant neoplasm of prostate: Secondary | ICD-10-CM | POA: Diagnosis not present

## 2018-04-23 ENCOUNTER — Ambulatory Visit
Admission: RE | Admit: 2018-04-23 | Discharge: 2018-04-23 | Disposition: A | Discharge status: Home / Self Care | Payer: Medicare Other | Source: Ambulatory Visit | Attending: Radiation Oncology | Admitting: Radiation Oncology

## 2018-04-23 ENCOUNTER — Other Ambulatory Visit: Payer: Self-pay

## 2018-04-23 DIAGNOSIS — C61 Malignant neoplasm of prostate: Secondary | ICD-10-CM | POA: Diagnosis not present

## 2018-04-26 ENCOUNTER — Other Ambulatory Visit: Payer: Self-pay

## 2018-04-26 ENCOUNTER — Ambulatory Visit
Admission: RE | Admit: 2018-04-26 | Discharge: 2018-04-26 | Disposition: A | Discharge status: Home / Self Care | Payer: Medicare Other | Source: Ambulatory Visit | Attending: Radiation Oncology | Admitting: Radiation Oncology

## 2018-04-26 DIAGNOSIS — C61 Malignant neoplasm of prostate: Secondary | ICD-10-CM | POA: Diagnosis not present

## 2018-04-27 ENCOUNTER — Ambulatory Visit
Admission: RE | Admit: 2018-04-27 | Discharge: 2018-04-27 | Disposition: A | Discharge status: Home / Self Care | Payer: Medicare Other | Source: Ambulatory Visit | Attending: Radiation Oncology | Admitting: Radiation Oncology

## 2018-04-27 ENCOUNTER — Other Ambulatory Visit: Payer: Self-pay

## 2018-04-27 DIAGNOSIS — C61 Malignant neoplasm of prostate: Secondary | ICD-10-CM | POA: Diagnosis not present

## 2018-04-28 ENCOUNTER — Ambulatory Visit
Admission: RE | Admit: 2018-04-28 | Discharge: 2018-04-28 | Disposition: A | Discharge status: Home / Self Care | Payer: Medicare Other | Source: Ambulatory Visit | Attending: Radiation Oncology | Admitting: Radiation Oncology

## 2018-04-28 ENCOUNTER — Other Ambulatory Visit: Payer: Self-pay

## 2018-04-28 DIAGNOSIS — Z79899 Other long term (current) drug therapy: Secondary | ICD-10-CM | POA: Diagnosis not present

## 2018-04-28 DIAGNOSIS — C61 Malignant neoplasm of prostate: Secondary | ICD-10-CM | POA: Insufficient documentation

## 2018-04-29 ENCOUNTER — Ambulatory Visit
Admission: RE | Admit: 2018-04-29 | Discharge: 2018-04-29 | Disposition: A | Discharge status: Home / Self Care | Payer: Medicare Other | Source: Ambulatory Visit | Attending: Radiation Oncology | Admitting: Radiation Oncology

## 2018-04-29 ENCOUNTER — Other Ambulatory Visit: Payer: Self-pay

## 2018-04-29 DIAGNOSIS — C61 Malignant neoplasm of prostate: Secondary | ICD-10-CM | POA: Diagnosis not present

## 2018-04-30 ENCOUNTER — Ambulatory Visit
Admission: RE | Admit: 2018-04-30 | Discharge: 2018-04-30 | Disposition: A | Discharge status: Home / Self Care | Payer: Medicare Other | Source: Ambulatory Visit | Attending: Radiation Oncology | Admitting: Radiation Oncology

## 2018-04-30 ENCOUNTER — Other Ambulatory Visit: Payer: Self-pay

## 2018-04-30 DIAGNOSIS — C61 Malignant neoplasm of prostate: Secondary | ICD-10-CM | POA: Diagnosis not present

## 2018-05-03 ENCOUNTER — Other Ambulatory Visit: Payer: Self-pay

## 2018-05-03 ENCOUNTER — Ambulatory Visit: Payer: Medicare Other

## 2018-05-03 ENCOUNTER — Ambulatory Visit
Admission: RE | Admit: 2018-05-03 | Discharge: 2018-05-03 | Disposition: A | Discharge status: Home / Self Care | Payer: Medicare Other | Source: Ambulatory Visit | Attending: Radiation Oncology | Admitting: Radiation Oncology

## 2018-05-03 DIAGNOSIS — C61 Malignant neoplasm of prostate: Secondary | ICD-10-CM | POA: Diagnosis not present

## 2018-05-04 ENCOUNTER — Ambulatory Visit
Admission: RE | Admit: 2018-05-04 | Discharge: 2018-05-04 | Disposition: A | Discharge status: Home / Self Care | Payer: Medicare Other | Source: Ambulatory Visit | Attending: Radiation Oncology | Admitting: Radiation Oncology

## 2018-05-04 ENCOUNTER — Other Ambulatory Visit: Payer: Self-pay

## 2018-05-04 ENCOUNTER — Encounter: Payer: Self-pay | Admitting: Radiation Oncology

## 2018-05-04 DIAGNOSIS — C61 Malignant neoplasm of prostate: Secondary | ICD-10-CM | POA: Diagnosis not present

## 2018-06-03 NOTE — Progress Notes (Signed)
  Radiation Oncology         [phone]) [phone] ________________________________  Name: Troy Walter MRN: [id number]  Date: 05/04/2018  DOB: 28-Jul-1945  End of Treatment Note  Diagnosis:   73 y.o. gentleman with stage T3N1 adenocarcinoma of the prostate with a Gleason's score of 4+5 and a PSA of 13.5     Indication for treatment:  Curative, Definitive Radiotherapy       Radiation treatment dates:   2/11/05/04/2018  Site/dose:  1. The prostate, seminal vesicles, and pelvic lymph nodes were initially treated to 45 Gy in 25 fractions of 1.8 Gy  2. The prostate only was boosted to 75 Gy with 15 additional fractions of 2.0 Gy   Beams/energy:  1. The prostate, seminal vesicles, and pelvic lymph nodes were initially treated using VMAT intensity modulated radiotherapy delivering 6 megavolt photons. Image guidance was performed with CB-CT studies prior to each fraction. He was immobilized with a body fix lower extremity mold.  2. the prostate only was boosted using VMAT intensity modulated radiotherapy delivering 6 megavolt photons. Image guidance was performed with CB-CT studies prior to each fraction. He was immobilized with a body fix lower extremity mold.  Narrative: The patient tolerated radiation treatment relatively well.   The patient experienced some minor urinary irritation and modest fatigue.    Plan: The patient has completed radiation treatment. He will return to radiation oncology clinic for routine followup in one month. I advised him to call or return sooner if he has any questions or concerns related to his recovery or treatment. ________________________________  Sheral Apley. Tammi Klippel, M.D.

## 2018-08-05 ENCOUNTER — Other Ambulatory Visit: Payer: Self-pay

## 2018-08-05 DIAGNOSIS — Z20822 Contact with and (suspected) exposure to covid-19: Secondary | ICD-10-CM

## 2018-08-09 LAB — NOVEL CORONAVIRUS, NAA: SARS-CoV-2, NAA: NOT DETECTED

## 2018-08-20 ENCOUNTER — Ambulatory Visit: Payer: Medicare Other | Admitting: Urology

## 2018-08-27 ENCOUNTER — Ambulatory Visit (INDEPENDENT_AMBULATORY_CARE_PROVIDER_SITE_OTHER): Payer: Medicare Other | Admitting: Urology

## 2018-08-27 ENCOUNTER — Other Ambulatory Visit: Payer: Self-pay

## 2018-08-27 DIAGNOSIS — C61 Malignant neoplasm of prostate: Secondary | ICD-10-CM

## 2018-08-27 DIAGNOSIS — C775 Secondary and unspecified malignant neoplasm of intrapelvic lymph nodes: Secondary | ICD-10-CM | POA: Diagnosis not present

## 2018-08-27 DIAGNOSIS — N403 Nodular prostate with lower urinary tract symptoms: Secondary | ICD-10-CM

## 2018-08-27 DIAGNOSIS — R351 Nocturia: Secondary | ICD-10-CM

## 2018-10-22 ENCOUNTER — Other Ambulatory Visit: Payer: Self-pay

## 2018-10-22 DIAGNOSIS — Z20822 Contact with and (suspected) exposure to covid-19: Secondary | ICD-10-CM

## 2018-10-23 LAB — NOVEL CORONAVIRUS, NAA: SARS-CoV-2, NAA: NOT DETECTED

## 2018-10-25 ENCOUNTER — Telehealth: Payer: Self-pay | Admitting: General Practice

## 2018-10-25 NOTE — Telephone Encounter (Signed)
Negative COVID results given. Patient results "NOT Detected." Caller expressed understanding. ° °

## 2018-11-09 ENCOUNTER — Emergency Department (HOSPITAL_COMMUNITY): Payer: Medicare Other

## 2018-11-09 ENCOUNTER — Encounter (HOSPITAL_COMMUNITY): Payer: Self-pay | Admitting: Emergency Medicine

## 2018-11-09 ENCOUNTER — Other Ambulatory Visit: Payer: Self-pay

## 2018-11-09 ENCOUNTER — Emergency Department (HOSPITAL_COMMUNITY)
Admission: EM | Admit: 2018-11-09 | Discharge: 2018-11-09 | Disposition: A | Discharge status: Home / Self Care | Payer: Medicare Other | Attending: Emergency Medicine | Admitting: Emergency Medicine

## 2018-11-09 DIAGNOSIS — Z20828 Contact with and (suspected) exposure to other viral communicable diseases: Secondary | ICD-10-CM | POA: Diagnosis not present

## 2018-11-09 DIAGNOSIS — Z79899 Other long term (current) drug therapy: Secondary | ICD-10-CM | POA: Diagnosis not present

## 2018-11-09 DIAGNOSIS — Z8546 Personal history of malignant neoplasm of prostate: Secondary | ICD-10-CM | POA: Insufficient documentation

## 2018-11-09 DIAGNOSIS — R1010 Upper abdominal pain, unspecified: Secondary | ICD-10-CM | POA: Insufficient documentation

## 2018-11-09 DIAGNOSIS — R509 Fever, unspecified: Secondary | ICD-10-CM | POA: Diagnosis not present

## 2018-11-09 DIAGNOSIS — R112 Nausea with vomiting, unspecified: Secondary | ICD-10-CM

## 2018-11-09 LAB — COMPREHENSIVE METABOLIC PANEL
ALT: 24 U/L (ref 0–44)
AST: 25 U/L (ref 15–41)
Albumin: 3.9 g/dL (ref 3.5–5.0)
Alkaline Phosphatase: 81 U/L (ref 38–126)
Anion gap: 9 (ref 5–15)
BUN: 18 mg/dL (ref 8–23)
CO2: 25 mmol/L (ref 22–32)
Calcium: 9.2 mg/dL (ref 8.9–10.3)
Chloride: 102 mmol/L (ref 98–111)
Creatinine, Ser: 1.1 mg/dL (ref 0.61–1.24)
GFR calc Af Amer: >60 mL/min (ref >60)
GFR calc non Af Amer: >60 mL/min (ref >60)
Glucose, Bld: 110 mg/dL — ABNORMAL HIGH (ref 70–99)
Potassium: 4.1 mmol/L (ref 3.5–5.1)
Sodium: 136 mmol/L (ref 135–145)
Total Bilirubin: 0.9 mg/dL (ref 0.3–1.2)
Total Protein: 7.4 g/dL (ref 6.5–8.1)

## 2018-11-09 LAB — CBC WITH DIFFERENTIAL/PLATELET
Abs Immature Granulocytes: 0.03 10*3/uL (ref 0.00–0.07)
Basophils Absolute: 0 10*3/uL (ref 0.0–0.1)
Basophils Relative: 0 %
Eosinophils Absolute: 0.1 10*3/uL (ref 0.0–0.5)
Eosinophils Relative: 2 %
HCT: 36.5 % — ABNORMAL LOW (ref 39.0–52.0)
Hemoglobin: 12 g/dL — ABNORMAL LOW (ref 13.0–17.0)
Immature Granulocytes: 0 %
Lymphocytes Relative: 6 %
Lymphs Abs: 0.5 10*3/uL — ABNORMAL LOW (ref 0.7–4.0)
MCH: 31.7 pg (ref 26.0–34.0)
MCHC: 32.9 g/dL (ref 30.0–36.0)
MCV: 96.3 fL (ref 80.0–100.0)
Monocytes Absolute: 0.6 10*3/uL (ref 0.1–1.0)
Monocytes Relative: 8 %
Neutro Abs: 6.6 10*3/uL (ref 1.7–7.7)
Neutrophils Relative %: 84 %
Platelets: 190 10*3/uL (ref 150–400)
RBC: 3.79 MIL/uL — ABNORMAL LOW (ref 4.22–5.81)
RDW: 13.1 % (ref 11.5–15.5)
WBC: 7.8 10*3/uL (ref 4.0–10.5)
nRBC: 0 % (ref 0.0–0.2)

## 2018-11-09 LAB — URINALYSIS, ROUTINE W REFLEX MICROSCOPIC
Bilirubin Urine: NEGATIVE
Glucose, UA: NEGATIVE mg/dL
Hgb urine dipstick: NEGATIVE
Ketones, ur: NEGATIVE mg/dL
Leukocytes,Ua: NEGATIVE
Nitrite: NEGATIVE
Protein, ur: NEGATIVE mg/dL
Specific Gravity, Urine: 1.016 (ref 1.005–1.030)
pH: 5 (ref 5.0–8.0)

## 2018-11-09 LAB — LACTIC ACID, PLASMA: Lactic Acid, Venous: 1.1 mmol/L (ref 0.5–1.9)

## 2018-11-09 LAB — LIPASE, BLOOD: Lipase: 24 U/L (ref 11–51)

## 2018-11-09 MED ORDER — SODIUM CHLORIDE 0.9% FLUSH
3.0000 mL | Freq: Once | INTRAVENOUS | Status: AC
  Administered 2018-11-09: 13:00:00 3 mL via INTRAVENOUS

## 2018-11-09 MED ORDER — ACETAMINOPHEN 325 MG PO TABS
650.0000 mg | ORAL_TABLET | Freq: Once | ORAL | Status: AC
  Administered 2018-11-09: 13:00:00 650 mg via ORAL
  Filled 2018-11-09: qty 2

## 2018-11-09 MED ORDER — ONDANSETRON 4 MG PO TBDP: 4.0000 mg | ORAL_TABLET | Freq: Three times a day (TID) | ORAL | 0 refills | Status: DC | PRN

## 2018-11-09 MED ORDER — ONDANSETRON HCL 4 MG/2ML IJ SOLN
4.0000 mg | Freq: Once | INTRAMUSCULAR | Status: DC
  Filled 2018-11-09: qty 2

## 2018-11-09 MED ORDER — IOHEXOL 300 MG/ML  SOLN
100.0000 mL | Freq: Once | INTRAMUSCULAR | Status: AC | PRN
  Administered 2018-11-09: 16:00:00 100 mL via INTRAVENOUS

## 2018-11-09 MED ORDER — SODIUM CHLORIDE 0.9 % IV BOLUS
500.0000 mL | Freq: Once | INTRAVENOUS | Status: AC
  Administered 2018-11-09: 13:00:00 500 mL via INTRAVENOUS

## 2018-11-09 NOTE — ED Triage Notes (Signed)
Per EMS patient from home. Patient complains of nausea. Denies fever/chills. Patient temp 103.3 in triage.

## 2018-11-09 NOTE — Discharge Instructions (Addendum)
You were seen in the emergency department today for fever, chills, and vomiting.  Your work-up was reassuring.  Your labs, chest x-ray, and CT did not show any substantial abnormalities.  We tested you for coronavirus, we will call you if results are positive in the next 48 hours.  We also sent off blood cultures, if these are concerning for infection we will call you and ask you were to return to the ER.  We are sending home with a prescription for Zofran to take as needed for nausea and vomiting.  Please take Tylenol per over-the-counter dosing to help with fevers.  We have prescribed you new medication(s) today. Discuss the medications prescribed today with your pharmacist as they can have adverse effects and interactions with your other medicines including over the counter and prescribed medications. Seek medical evaluation if you start to experience new or abnormal symptoms after taking one of these medicines, seek care immediately if you start to experience difficulty breathing, feeling of your throat closing, facial swelling, or rash as these could be indications of a more serious allergic reaction  Please follow-up with primary care within 1 to 3 days.  Return to the ER for new or worsening symptoms including but not limited to fever not improved with Tylenol, inability to keep fluids down, abdominal pain, trouble breathing, coughing, or any other concerns.  While waiting for your coronavirus test result please be sure to use good hand hygiene and be sure to quarantine.       Person Under Monitoring Name: Troy Walter  Location: [address] [place] [address]   Infection Prevention Recommendations for Individuals Confirmed to have, or Being Evaluated for, 2019 Novel Coronavirus (COVID-19) Infection Who Receive Care at Home  Individuals who are confirmed to have, or are being evaluated for, COVID-19 should follow the prevention steps below until a healthcare provider or  local or state health department says they can return to normal activities.  Stay home except to get medical care You should restrict activities outside your home, except for getting medical care. Do not go to work, school, or public areas, and do not use public transportation or taxis.  Call ahead before visiting your doctor Before your medical appointment, call the healthcare provider and tell them that you have, or are being evaluated for, COVID-19 infection. This will help the healthcare providers office take steps to keep other people from getting infected. Ask your healthcare provider to call the local or state health department.  Monitor your symptoms Seek prompt medical attention if your illness is worsening (e.g., difficulty breathing). Before going to your medical appointment, call the healthcare provider and tell them that you have, or are being evaluated for, COVID-19 infection. Ask your healthcare provider to call the local or state health department.  Wear a facemask You should wear a facemask that covers your nose and mouth when you are in the same room with other people and when you visit a healthcare provider. People who live with or visit you should also wear a facemask while they are in the same room with you.  Separate yourself from other people in your home As much as possible, you should stay in a different room from other people in your home. Also, you should use a separate bathroom, if available.  Avoid sharing household items You should not share dishes, drinking glasses, cups, eating utensils, towels, bedding, or other items with other people in your home. After using these items, you should  wash them thoroughly with soap and water.  Cover your coughs and sneezes Cover your mouth and nose with a tissue when you cough or sneeze, or you can cough or sneeze into your sleeve. Throw used tissues in a lined trash can, and immediately wash your hands with soap and  water for at least 20 seconds or use an alcohol-based hand rub.  Wash your [facility] your hands often and thoroughly with soap and water for at least 20 seconds. You can use an alcohol-based hand sanitizer if soap and water are not available and if your hands are not visibly dirty. Avoid touching your eyes, nose, and mouth with unwashed hands.   Prevention Steps for Caregivers and Household Members of Individuals Confirmed to have, or Being Evaluated for, COVID-19 Infection Being Cared for in the Home  If you live with, or provide care at home for, a person confirmed to have, or being evaluated for, COVID-19 infection please follow these guidelines to prevent infection:  Follow healthcare providers instructions Make sure that you understand and can help the patient follow any healthcare provider instructions for all care.  Provide for the patients basic needs You should help the patient with basic needs in the home and provide support for getting groceries, prescriptions, and other personal needs.  Monitor the patients symptoms If they are getting sicker, call his or her medical provider and tell them that the patient has, or is being evaluated for, COVID-19 infection. This will help the healthcare providers office take steps to keep other people from getting infected. Ask the healthcare provider to call the local or state health department.  Limit the number of people who have contact with the patient If possible, have only one caregiver for the patient. Other household members should stay in another home or place of residence. If this is not possible, they should stay in another room, or be separated from the patient as much as possible. Use a separate bathroom, if available. Restrict visitors who do not have an essential need to be in the home.  Keep older adults, very young children, and other sick people away from the patient Keep older adults, very young children, and  those who have compromised immune systems or chronic health conditions away from the patient. This includes people with chronic heart, lung, or kidney conditions, diabetes, and cancer.  Ensure good ventilation Make sure that shared spaces in the home have good air flow, such as from an air conditioner or an opened window, weather permitting.  Wash your hands often Wash your hands often and thoroughly with soap and water for at least 20 seconds. You can use an alcohol based hand sanitizer if soap and water are not available and if your hands are not visibly dirty. Avoid touching your eyes, nose, and mouth with unwashed hands. Use disposable paper towels to dry your hands. If not available, use dedicated cloth towels and replace them when they become wet.  Wear a facemask and gloves Wear a disposable facemask at all times in the room and gloves when you touch or have contact with the patients blood, body fluids, and/or secretions or excretions, such as sweat, saliva, sputum, nasal mucus, vomit, urine, or feces.  Ensure the mask fits over your nose and mouth tightly, and do not touch it during use. Throw out disposable facemasks and gloves after using them. Do not reuse. Wash your hands immediately after removing your facemask and gloves. If your personal clothing becomes contaminated, carefully remove  clothing and launder. Wash your hands after handling contaminated clothing. Place all used disposable facemasks, gloves, and other waste in a lined container before disposing them with other household waste. Remove gloves and wash your hands immediately after handling these items.  Do not share dishes, glasses, or other household items with the patient Avoid sharing household items. You should not share dishes, drinking glasses, cups, eating utensils, towels, bedding, or other items with a patient who is confirmed to have, or being evaluated for, COVID-19 infection. After the person uses these  items, you should wash them thoroughly with soap and water.  Wash laundry thoroughly Immediately remove and wash clothes or bedding that have blood, body fluids, and/or secretions or excretions, such as sweat, saliva, sputum, nasal mucus, vomit, urine, or feces, on them. Wear gloves when handling laundry from the patient. Read and follow directions on labels of laundry or clothing items and detergent. In general, wash and dry with the warmest temperatures recommended on the label.  Clean all areas the individual has used often Clean all touchable surfaces, such as counters, tabletops, doorknobs, bathroom fixtures, toilets, phones, keyboards, tablets, and bedside tables, every day. Also, clean any surfaces that may have blood, body fluids, and/or secretions or excretions on them. Wear gloves when cleaning surfaces the patient has come in contact with. Use a diluted bleach solution (e.g., dilute bleach with 1 part bleach and 10 parts water) or a household disinfectant with a label that says EPA-registered for coronaviruses. To make a bleach solution at home, add 1 tablespoon of bleach to 1 quart (4 cups) of water. For a larger supply, add  cup of bleach to 1 gallon (16 cups) of water. Read labels of cleaning products and follow recommendations provided on product labels. Labels contain instructions for safe and effective use of the cleaning product including precautions you should take when applying the product, such as wearing gloves or eye protection and making sure you have good ventilation during use of the product. Remove gloves and wash hands immediately after cleaning.  Monitor yourself for signs and symptoms of illness Caregivers and household members are considered close contacts, should monitor their health, and will be asked to limit movement outside of the home to the extent possible. Follow the monitoring steps for close contacts listed on the symptom monitoring form.   ? If you have  additional questions, contact your local health department or call the epidemiologist on call at [phone] (available 24/7). ? This guidance is subject to change. For the most up-to-date guidance from [facility], please refer to their website: [email or web address]
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
## 2018-11-10 LAB — NOVEL CORONAVIRUS, NAA (HOSP ORDER, SEND-OUT TO REF LAB; TAT 18-24 HRS): SARS-CoV-2, NAA: NOT DETECTED

## 2018-11-11 LAB — URINE CULTURE: Culture: NO GROWTH

## 2018-11-15 LAB — CULTURE, BLOOD (ROUTINE X 2)
Culture: NO GROWTH
Culture: NO GROWTH
Special Requests: ADEQUATE

## 2018-11-18 ENCOUNTER — Other Ambulatory Visit: Payer: Self-pay | Admitting: Family Medicine

## 2018-11-18 DIAGNOSIS — K820 Obstruction of gallbladder: Secondary | ICD-10-CM

## 2018-11-24 ENCOUNTER — Ambulatory Visit
Admission: RE | Admit: 2018-11-24 | Discharge: 2018-11-24 | Disposition: A | Discharge status: Home / Self Care | Payer: Medicare Other | Source: Ambulatory Visit | Attending: Family Medicine | Admitting: Family Medicine

## 2018-11-24 DIAGNOSIS — K820 Obstruction of gallbladder: Secondary | ICD-10-CM

## 2019-02-07 ENCOUNTER — Telehealth: Payer: Self-pay | Admitting: Urology

## 2019-02-07 ENCOUNTER — Other Ambulatory Visit: Payer: Self-pay

## 2019-02-07 DIAGNOSIS — C61 Malignant neoplasm of prostate: Secondary | ICD-10-CM

## 2019-02-07 NOTE — Telephone Encounter (Signed)
Order placed for pt. Can you please print for pt and mail to pt or fax to lab for pt. Thanks

## 2019-02-07 NOTE — Telephone Encounter (Signed)
Pt needs lab order. He has appt on 02/18/19.

## 2019-02-07 NOTE — Telephone Encounter (Signed)
Patient states he is coming by Tuesday to pick up lab order.

## 2019-02-08 ENCOUNTER — Other Ambulatory Visit: Payer: Self-pay | Admitting: Urology

## 2019-02-09 ENCOUNTER — Other Ambulatory Visit: Payer: Self-pay | Admitting: Urology

## 2019-02-09 LAB — PSA: PSA: <0.1 ng/mL (ref <=4.0)

## 2019-02-09 LAB — TESTOSTERONE: Testosterone: <10 ng/dL — ABNORMAL LOW (ref 250–827)

## 2019-02-16 ENCOUNTER — Telehealth: Payer: Self-pay

## 2019-02-16 NOTE — Telephone Encounter (Signed)
Letter mailed of psa result per Dr. Diona Fanti

## 2019-02-16 NOTE — Telephone Encounter (Signed)
-----   Message from Dorisann Frames, RN sent at 02/16/2019 12:41 PM EST ----- Called. No answer. No way to leave message

## 2019-02-18 ENCOUNTER — Ambulatory Visit (INDEPENDENT_AMBULATORY_CARE_PROVIDER_SITE_OTHER): Payer: Medicare Other | Admitting: Urology

## 2019-02-18 ENCOUNTER — Encounter: Payer: Self-pay | Admitting: Urology

## 2019-02-18 ENCOUNTER — Other Ambulatory Visit: Payer: Self-pay

## 2019-02-18 VITALS — BP 163/83 | HR 71 | Temp 98.4°F | Ht 70.0 in | Wt 215.0 lb

## 2019-02-18 DIAGNOSIS — T50905A Adverse effect of unspecified drugs, medicaments and biological substances, initial encounter: Secondary | ICD-10-CM

## 2019-02-18 DIAGNOSIS — R351 Nocturia: Secondary | ICD-10-CM

## 2019-02-18 DIAGNOSIS — C61 Malignant neoplasm of prostate: Secondary | ICD-10-CM | POA: Diagnosis not present

## 2019-02-18 DIAGNOSIS — C775 Secondary and unspecified malignant neoplasm of intrapelvic lymph nodes: Secondary | ICD-10-CM

## 2019-02-18 DIAGNOSIS — N403 Nodular prostate with lower urinary tract symptoms: Secondary | ICD-10-CM | POA: Diagnosis not present

## 2019-02-18 DIAGNOSIS — R232 Flushing: Secondary | ICD-10-CM

## 2019-02-18 MED ORDER — LEUPROLIDE ACETATE (6 MONTH) 45 MG IM KIT
45.0000 mg | PACK | Freq: Once | INTRAMUSCULAR | Status: AC
  Administered 2019-02-23: 11:00:00 45 mg via INTRAMUSCULAR

## 2019-02-18 MED ORDER — TAMSULOSIN HCL 0.4 MG PO CAPS: 0.4000 mg | ORAL_CAPSULE | Freq: Every evening | ORAL | 3 refills | Status: DC

## 2019-02-18 MED ORDER — LEUPROLIDE ACETATE (6 MONTH) 45 MG IM KIT: 45.0000 mg | PACK | Freq: Once | INTRAMUSCULAR | Status: DC

## 2019-02-18 NOTE — Progress Notes (Signed)
Pt. Unable to urinate  
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
## 2019-02-23 ENCOUNTER — Ambulatory Visit (INDEPENDENT_AMBULATORY_CARE_PROVIDER_SITE_OTHER): Payer: Medicare Other

## 2019-02-23 ENCOUNTER — Other Ambulatory Visit: Payer: Self-pay

## 2019-02-23 DIAGNOSIS — C61 Malignant neoplasm of prostate: Secondary | ICD-10-CM | POA: Diagnosis not present

## 2019-02-23 NOTE — Progress Notes (Signed)
Pt. Here for lupron injection. Tolerated with no difficulty.

## 2019-03-19 ENCOUNTER — Ambulatory Visit: Payer: Medicare Other | Attending: Internal Medicine

## 2019-03-19 DIAGNOSIS — Z23 Encounter for immunization: Secondary | ICD-10-CM

## 2019-03-19 NOTE — Progress Notes (Signed)
   Covid-19 Vaccination Clinic  Name:  LEXUS STENNER    MRN: [id number] DOB: Dec 02, 1945  03/19/2019  Mr. Kruckenberg was observed post Covid-19 immunization for 15 minutes without incidence. He was provided with Vaccine Information Sheet and instruction to access the V-Safe system.   Mr. Wilbur was instructed to call 911 with any severe reactions post vaccine: Marland Kitchen Difficulty breathing  . Swelling of your face and throat  . A fast heartbeat  . A bad rash all over your body  . Dizziness and weakness    Immunizations Administered    Name Date Dose VIS Date Route   Pfizer COVID-19 Vaccine 03/19/2019  9:35 AM 0.3 mL 01/07/2019 Intramuscular   Manufacturer: [place]   Lot: [id number]   [place]: [id number]

## 2019-04-11 ENCOUNTER — Ambulatory Visit: Payer: Medicare HMO | Attending: Internal Medicine

## 2019-04-11 DIAGNOSIS — Z23 Encounter for immunization: Secondary | ICD-10-CM

## 2019-04-11 NOTE — Progress Notes (Signed)
   Covid-19 Vaccination Clinic  Name:  Troy Walter    MRN: [id number] DOB: 08-15-1945  04/11/2019  [name] was observed post Covid-19 immunization for 15 minutes without incident. He was provided with Vaccine Information Sheet and instruction to access the V-Safe system.   Mr. Choice was instructed to call 911 with any severe reactions post vaccine: Marland Kitchen Difficulty breathing  . Swelling of face and throat  . A fast heartbeat  . A bad rash all over body  . Dizziness and weakness   Immunizations Administered    Name Date Dose VIS Date Route   Pfizer COVID-19 Vaccine 04/11/2019  8:53 AM 0.3 mL 01/07/2019 Intramuscular   Manufacturer: [place]   Lot: [id number]   [place]: [id number]

## 2019-04-24 ENCOUNTER — Emergency Department (HOSPITAL_COMMUNITY)
Admission: EM | Admit: 2019-04-24 | Discharge: 2019-04-25 | Disposition: A | Discharge status: Home / Self Care | Payer: Medicare HMO | Attending: Emergency Medicine | Admitting: Emergency Medicine

## 2019-04-24 ENCOUNTER — Encounter (HOSPITAL_COMMUNITY): Payer: Self-pay | Admitting: Emergency Medicine

## 2019-04-24 ENCOUNTER — Other Ambulatory Visit: Payer: Self-pay

## 2019-04-24 DIAGNOSIS — Y939 Activity, unspecified: Secondary | ICD-10-CM | POA: Diagnosis not present

## 2019-04-24 DIAGNOSIS — W312XXA Contact with powered woodworking and forming machines, initial encounter: Secondary | ICD-10-CM | POA: Insufficient documentation

## 2019-04-24 DIAGNOSIS — Y929 Unspecified place or not applicable: Secondary | ICD-10-CM | POA: Diagnosis not present

## 2019-04-24 DIAGNOSIS — S61411A Laceration without foreign body of right hand, initial encounter: Secondary | ICD-10-CM

## 2019-04-24 DIAGNOSIS — Z79899 Other long term (current) drug therapy: Secondary | ICD-10-CM | POA: Insufficient documentation

## 2019-04-24 DIAGNOSIS — Y999 Unspecified external cause status: Secondary | ICD-10-CM | POA: Insufficient documentation

## 2019-04-24 NOTE — ED Triage Notes (Signed)
Bleeding controlled.

## 2019-04-24 NOTE — ED Triage Notes (Signed)
Patient has an aproximately 1.5 inch cut to the top of his right hand proximal to his thumb. Pt was using a skill saw.

## 2019-04-25 DIAGNOSIS — S61411A Laceration without foreign body of right hand, initial encounter: Secondary | ICD-10-CM | POA: Diagnosis not present

## 2019-04-25 MED ORDER — LIDOCAINE HCL (PF) 2 % IJ SOLN
INTRAMUSCULAR | Status: AC
  Filled 2019-04-25: qty 10

## 2019-04-25 MED ORDER — ACETAMINOPHEN 500 MG PO TABS
1000.0000 mg | ORAL_TABLET | Freq: Once | ORAL | Status: AC
  Administered 2019-04-25: 1000 mg via ORAL
  Filled 2019-04-25: qty 2

## 2019-04-25 MED ORDER — CEPHALEXIN 500 MG PO CAPS: 500.0000 mg | ORAL_CAPSULE | Freq: Two times a day (BID) | ORAL | 0 refills | Status: DC

## 2019-04-25 NOTE — ED Notes (Signed)
Wound cleaned, dressing and thumb spica applied. Pt tolerated well.

## 2019-04-25 NOTE — Discharge Instructions (Addendum)
Keep wound clean with soap and water and watch for signs of infection such as pus draining, redness spreading or fevers. Have sutures removed in 7 to 10 days. You can use ice and Tylenol as needed for pain. Take antibiotics as needed/discussed if you have any signs of infection. Wear splint to minimize movement and improve healing time.
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
## 2019-05-05 ENCOUNTER — Ambulatory Visit
Admission: EM | Admit: 2019-05-05 | Discharge: 2019-05-05 | Disposition: A | Discharge status: Home / Self Care | Payer: Medicare HMO

## 2019-05-05 ENCOUNTER — Other Ambulatory Visit: Payer: Self-pay

## 2019-05-05 DIAGNOSIS — Z4802 Encounter for removal of sutures: Secondary | ICD-10-CM | POA: Diagnosis not present

## 2019-05-05 NOTE — ED Triage Notes (Signed)
Pt presents to have 6 sutures removed. Denies any other complaints.  Six sutures removed from right top of hand. Area is clean and dry. One small area still appears to be gaping two steristrips applied to area per [employer] PA.

## 2019-06-01 DIAGNOSIS — N400 Enlarged prostate without lower urinary tract symptoms: Secondary | ICD-10-CM | POA: Diagnosis not present

## 2019-06-01 DIAGNOSIS — R030 Elevated blood-pressure reading, without diagnosis of hypertension: Secondary | ICD-10-CM | POA: Diagnosis not present

## 2019-06-01 DIAGNOSIS — Z833 Family history of diabetes mellitus: Secondary | ICD-10-CM | POA: Diagnosis not present

## 2019-06-01 DIAGNOSIS — M1990 Unspecified osteoarthritis, unspecified site: Secondary | ICD-10-CM | POA: Diagnosis not present

## 2019-06-01 DIAGNOSIS — C61 Malignant neoplasm of prostate: Secondary | ICD-10-CM | POA: Diagnosis not present

## 2019-06-01 DIAGNOSIS — E669 Obesity, unspecified: Secondary | ICD-10-CM | POA: Diagnosis not present

## 2019-06-01 DIAGNOSIS — Z809 Family history of malignant neoplasm, unspecified: Secondary | ICD-10-CM | POA: Diagnosis not present

## 2019-06-01 DIAGNOSIS — Z6832 Body mass index (BMI) 32.0-32.9, adult: Secondary | ICD-10-CM | POA: Diagnosis not present

## 2019-06-01 DIAGNOSIS — Z8249 Family history of ischemic heart disease and other diseases of the circulatory system: Secondary | ICD-10-CM | POA: Diagnosis not present

## 2019-08-23 ENCOUNTER — Telehealth: Payer: Self-pay | Admitting: Urology

## 2019-08-23 NOTE — Telephone Encounter (Signed)
Pt left Vm stating he would like to discuss his insurance coverage.

## 2019-08-24 ENCOUNTER — Ambulatory Visit: Payer: Medicare Other | Admitting: Urology
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
## 2019-08-26 ENCOUNTER — Other Ambulatory Visit: Payer: Self-pay

## 2019-08-26 ENCOUNTER — Ambulatory Visit (INDEPENDENT_AMBULATORY_CARE_PROVIDER_SITE_OTHER): Payer: Medicare HMO | Admitting: Urology

## 2019-08-26 VITALS — BP 151/81 | HR 60 | Temp 98.0°F | Ht 70.0 in | Wt 230.0 lb

## 2019-08-26 DIAGNOSIS — R3915 Urgency of urination: Secondary | ICD-10-CM | POA: Diagnosis not present

## 2019-08-26 DIAGNOSIS — N403 Nodular prostate with lower urinary tract symptoms: Secondary | ICD-10-CM

## 2019-08-26 DIAGNOSIS — C61 Malignant neoplasm of prostate: Secondary | ICD-10-CM

## 2019-08-26 LAB — URINALYSIS, ROUTINE W REFLEX MICROSCOPIC
Bilirubin, UA: NEGATIVE
Glucose, UA: NEGATIVE
Ketones, UA: NEGATIVE
Leukocytes,UA: NEGATIVE
Nitrite, UA: NEGATIVE
Protein,UA: NEGATIVE
RBC, UA: NEGATIVE
Specific Gravity, UA: 1.025 (ref 1.005–1.030)
Urobilinogen, Ur: 0.2 mg/dL (ref 0.2–1.0)
pH, UA: 5.5 (ref 5.0–7.5)

## 2019-08-26 MED ORDER — LEUPROLIDE ACETATE (6 MONTH) 45 MG IM KIT
45.0000 mg | PACK | Freq: Once | INTRAMUSCULAR | Status: AC
  Administered 2019-08-26: 45 mg via INTRAMUSCULAR

## 2019-08-26 NOTE — Progress Notes (Signed)
Lupron IM Injection   Due to Prostate Cancer patient is present today for a Lupron Injection.  Medication: Lupron 6 month Dose: 45 mg  Location: left upper outer buttocks Lot: [id number] Exp: 02/18/22  Patient tolerated well, no complications were noted  Performed by: Mir Fullilove, LPN  Follow up: Per MD note

## 2019-08-26 NOTE — Progress Notes (Signed)
Urological Symptom Review  Patient is experiencing the following symptoms: Get up at night to urinate Weak stream Erection problems  Review of Systems  Gastrointestinal (upper)  : Negative for upper GI symptoms  Gastrointestinal (lower) : Negative for lower GI symptoms  Constitutional : Negative for symptoms  Skin: Negative for skin symptoms  Eyes: Negative for eye symptoms  Ear/Nose/Throat : Negative for Ear/Nose/Throat symptoms  Hematologic/Lymphatic: Negative for Hematologic/Lymphatic symptoms  Cardiovascular : Negative for cardiovascular symptoms  Respiratory : Negative for respiratory symptoms  Endocrine: Negative for endocrine symptoms  Musculoskeletal: Joint pain  Neurological: Negative for neurological symptoms  Psychologic: Negative for psychiatric symptoms

## 2019-08-27 LAB — LIPID PANEL
Chol/HDL Ratio: 3.9 ratio (ref 0.0–5.0)
Cholesterol, Total: 193 mg/dL (ref 100–199)
HDL: 50 mg/dL (ref >39)
LDL Chol Calc (NIH): 121 mg/dL — ABNORMAL HIGH (ref 0–99)
Triglycerides: 124 mg/dL (ref 0–149)
VLDL Cholesterol Cal: 22 mg/dL (ref 5–40)

## 2019-08-27 LAB — COMPREHENSIVE METABOLIC PANEL
ALT: 20 IU/L (ref 0–44)
AST: 24 IU/L (ref 0–40)
Albumin/Globulin Ratio: 1.5 (ref 1.2–2.2)
Albumin: 4.1 g/dL (ref 3.7–4.7)
Alkaline Phosphatase: 114 IU/L (ref 48–121)
BUN/Creatinine Ratio: 15 (ref 10–24)
BUN: 17 mg/dL (ref 8–27)
Bilirubin Total: 0.5 mg/dL (ref 0.0–1.2)
CO2: 24 mmol/L (ref 20–29)
Calcium: 9.6 mg/dL (ref 8.6–10.2)
Chloride: 101 mmol/L (ref 96–106)
Creatinine, Ser: 1.15 mg/dL (ref 0.76–1.27)
GFR calc Af Amer: 71 mL/min/{1.73_m2} (ref >59)
GFR calc non Af Amer: 61 mL/min/{1.73_m2} (ref >59)
Globulin, Total: 2.8 g/dL (ref 1.5–4.5)
Glucose: 98 mg/dL (ref 65–99)
Potassium: 4.8 mmol/L (ref 3.5–5.2)
Sodium: 138 mmol/L (ref 134–144)
Total Protein: 6.9 g/dL (ref 6.0–8.5)

## 2019-08-27 LAB — CBC
Hematocrit: 37.3 % — ABNORMAL LOW (ref 37.5–51.0)
Hemoglobin: 12.5 g/dL — ABNORMAL LOW (ref 13.0–17.7)
MCH: 32 pg (ref 26.6–33.0)
MCHC: 33.5 g/dL (ref 31.5–35.7)
MCV: 95 fL (ref 79–97)
Platelets: 196 10*3/uL (ref 150–450)
RBC: 3.91 x10E6/uL — ABNORMAL LOW (ref 4.14–5.80)
RDW: 13.2 % (ref 11.6–15.4)
WBC: 3.7 10*3/uL (ref 3.4–10.8)

## 2019-08-27 LAB — TESTOSTERONE: Testosterone: <3 ng/dL — ABNORMAL LOW (ref 264–916)

## 2019-08-27 LAB — PSA: Prostate Specific Ag, Serum: 0.2 ng/mL (ref 0.0–4.0)

## 2019-08-29 DIAGNOSIS — C61 Malignant neoplasm of prostate: Secondary | ICD-10-CM

## 2019-09-05 DIAGNOSIS — Z79899 Other long term (current) drug therapy: Secondary | ICD-10-CM | POA: Diagnosis not present

## 2019-09-05 DIAGNOSIS — R7309 Other abnormal glucose: Secondary | ICD-10-CM | POA: Diagnosis not present

## 2019-09-05 DIAGNOSIS — Z0001 Encounter for general adult medical examination with abnormal findings: Secondary | ICD-10-CM | POA: Diagnosis not present

## 2019-09-05 DIAGNOSIS — H9190 Unspecified hearing loss, unspecified ear: Secondary | ICD-10-CM | POA: Diagnosis not present

## 2019-09-05 DIAGNOSIS — E7800 Pure hypercholesterolemia, unspecified: Secondary | ICD-10-CM | POA: Diagnosis not present

## 2019-09-05 DIAGNOSIS — C61 Malignant neoplasm of prostate: Secondary | ICD-10-CM | POA: Diagnosis not present

## 2019-12-01 ENCOUNTER — Other Ambulatory Visit: Payer: Self-pay

## 2019-12-01 ENCOUNTER — Other Ambulatory Visit: Payer: Medicare Other

## 2019-12-01 DIAGNOSIS — C61 Malignant neoplasm of prostate: Secondary | ICD-10-CM

## 2019-12-02 LAB — PSA: Prostate Specific Ag, Serum: 0.4 ng/mL (ref 0.0–4.0)

## 2020-02-16 ENCOUNTER — Other Ambulatory Visit: Payer: Medicare HMO

## 2020-02-21 ENCOUNTER — Other Ambulatory Visit: Payer: Medicare Other

## 2020-02-21 ENCOUNTER — Other Ambulatory Visit: Payer: Self-pay

## 2020-02-21 DIAGNOSIS — C61 Malignant neoplasm of prostate: Secondary | ICD-10-CM
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
## 2020-02-23 ENCOUNTER — Ambulatory Visit (INDEPENDENT_AMBULATORY_CARE_PROVIDER_SITE_OTHER): Payer: Medicare Other | Admitting: Urology

## 2020-02-23 ENCOUNTER — Encounter: Payer: Self-pay | Admitting: Urology

## 2020-02-23 ENCOUNTER — Other Ambulatory Visit: Payer: Self-pay

## 2020-02-23 VITALS — BP 127/74 | HR 84 | Temp 98.0°F | Ht 70.0 in | Wt 215.0 lb

## 2020-02-23 DIAGNOSIS — N403 Nodular prostate with lower urinary tract symptoms: Secondary | ICD-10-CM

## 2020-02-23 DIAGNOSIS — C61 Malignant neoplasm of prostate: Secondary | ICD-10-CM

## 2020-02-23 DIAGNOSIS — Z79899 Other long term (current) drug therapy: Secondary | ICD-10-CM | POA: Diagnosis not present

## 2020-02-23 DIAGNOSIS — R3915 Urgency of urination: Secondary | ICD-10-CM

## 2020-02-23 DIAGNOSIS — R232 Flushing: Secondary | ICD-10-CM | POA: Diagnosis not present

## 2020-02-23 DIAGNOSIS — C775 Secondary and unspecified malignant neoplasm of intrapelvic lymph nodes: Secondary | ICD-10-CM

## 2020-02-23 DIAGNOSIS — R351 Nocturia: Secondary | ICD-10-CM

## 2020-02-23 DIAGNOSIS — T50905A Adverse effect of unspecified drugs, medicaments and biological substances, initial encounter: Secondary | ICD-10-CM

## 2020-02-23 LAB — PSA: Prostate Specific Ag, Serum: 0.4 ng/mL (ref 0.0–4.0)

## 2020-02-23 LAB — TESTOSTERONE: Testosterone: <3 ng/dL — ABNORMAL LOW (ref 264–916)

## 2020-02-23 MED ORDER — LEUPROLIDE ACETATE (6 MONTH) 45 MG IM KIT
45.0000 mg | PACK | Freq: Once | INTRAMUSCULAR | Status: AC
  Administered 2020-02-23: 45 mg via INTRAMUSCULAR

## 2020-02-23 MED ORDER — TAMSULOSIN HCL 0.4 MG PO CAPS: 0.4000 mg | ORAL_CAPSULE | Freq: Every evening | ORAL | 3 refills | Status: DC

## 2020-02-23 NOTE — Progress Notes (Signed)
Urological Symptom Review  Patient is experiencing the following symptoms: Burning/pain with urination Get up at night to urinate Leakage of urine Trouble starting stream   Review of Systems  Gastrointestinal (upper)  : Negative for upper GI symptoms  Gastrointestinal (lower) : Negative for lower GI symptoms  Constitutional : hot flashes  Skin: Negative for skin symptoms  Eyes: Negative for eye symptoms  Ear/Nose/Throat : Negative for Ear/Nose/Throat symptoms  Hematologic/Lymphatic: Negative for Hematologic/Lymphatic symptoms  Cardiovascular : Negative for cardiovascular symptoms  Respiratory : Negative for respiratory symptoms  Endocrine: Negative for endocrine symptoms  Musculoskeletal: Negative for musculoskeletal symptoms  Neurological: Negative for neurological symptoms  Psychologic: Negative for psychiatric symptoms

## 2020-02-24 ENCOUNTER — Ambulatory Visit: Payer: Medicare HMO | Admitting: Urology

## 2020-04-20 DIAGNOSIS — M25473 Effusion, unspecified ankle: Secondary | ICD-10-CM | POA: Diagnosis not present

## 2020-08-02 IMAGING — CT CT ABD-PELV W/ CM
2 of 5 series · 15 of 46 positions shown, 17 images · IV contrast (omnipaque)
Comparison: 11/26/2017

CLINICAL DATA: Fever, nausea/vomiting

EXAM:
CT ABDOMEN AND PELVIS WITH CONTRAST
TECHNIQUE: Multidetector CT imaging of the abdomen and pelvis was performed
using the standard protocol following bolus administration of
intravenous contrast.
CONTRAST:  100mL OMNIPAQUE IOHEXOL 300 MG/ML  SOLN

[Series 2: axial st · axial · 0.81mm/px · z∈[-361,+59]mm · 12 of 100 slices shown, 14 images]
[im 8/100  soft-tissue]
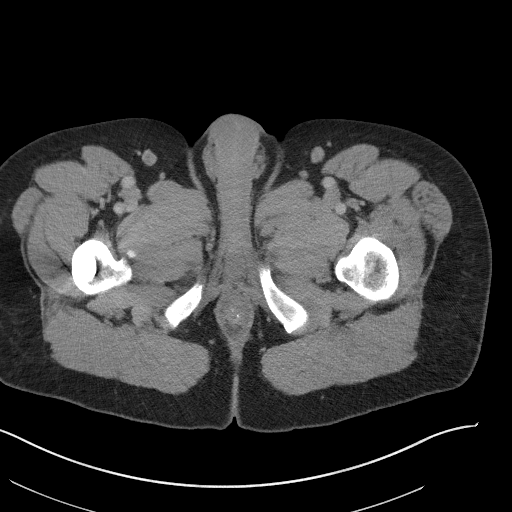
[im 8/100  bone]
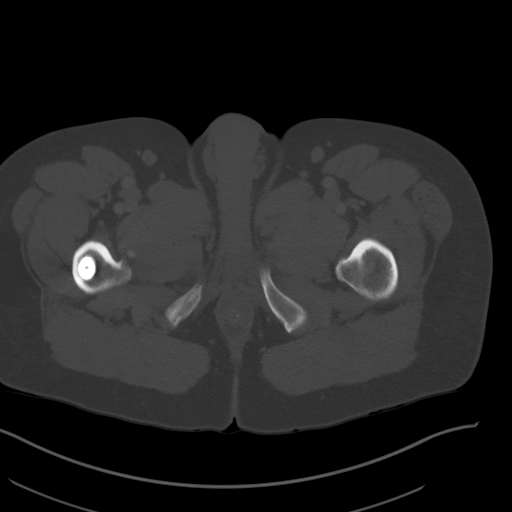
[im 16/100  soft-tissue]
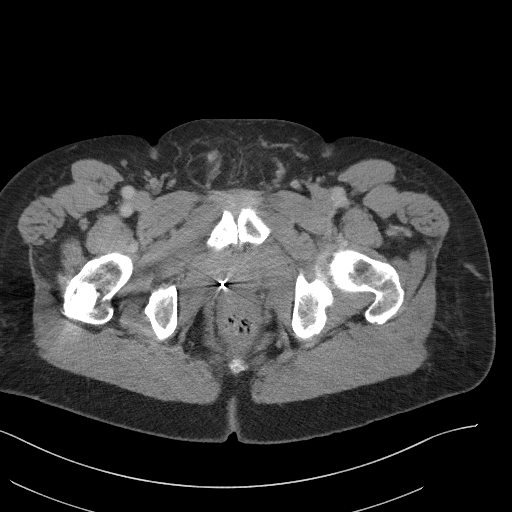
[im 23/100  soft-tissue]
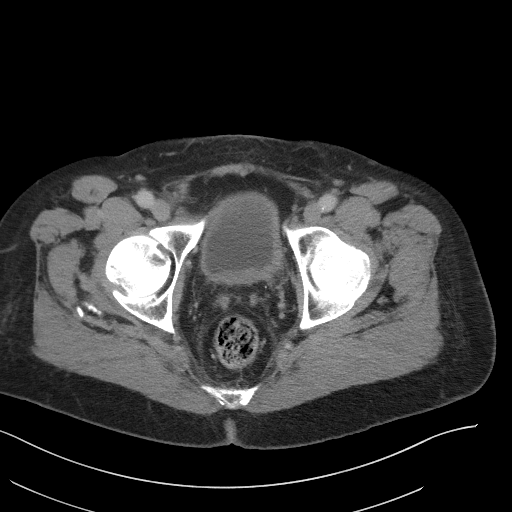
[im 31/100  soft-tissue]
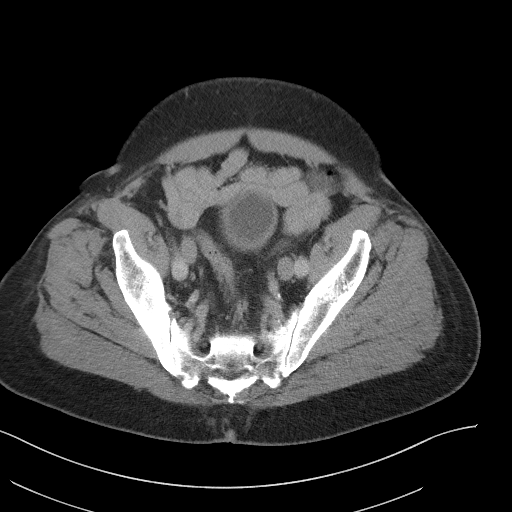
[im 39/100  soft-tissue]
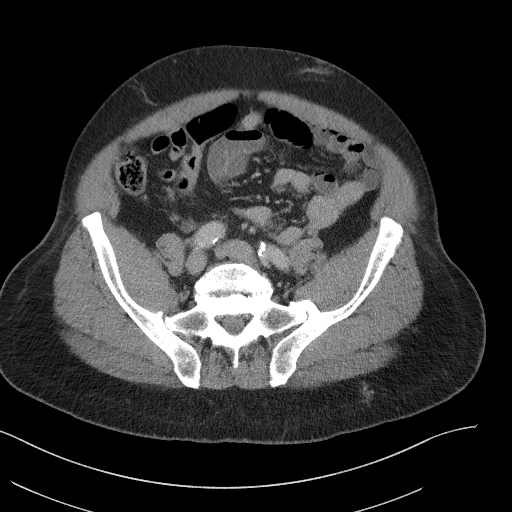
[im 46/100  soft-tissue]
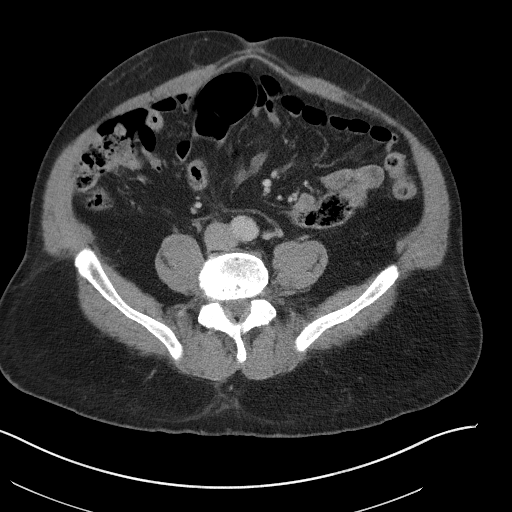
[im 54/100  soft-tissue]
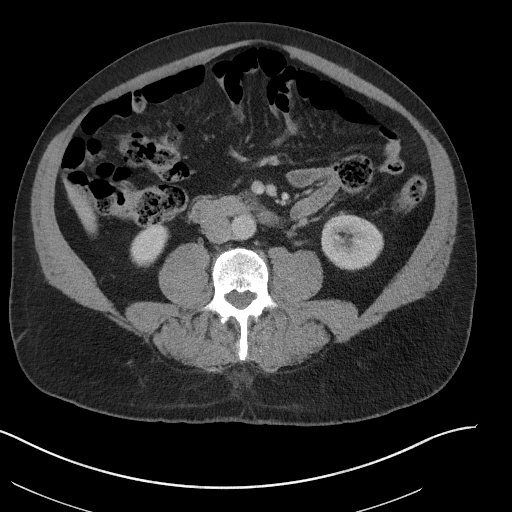
[im 61/100  soft-tissue]
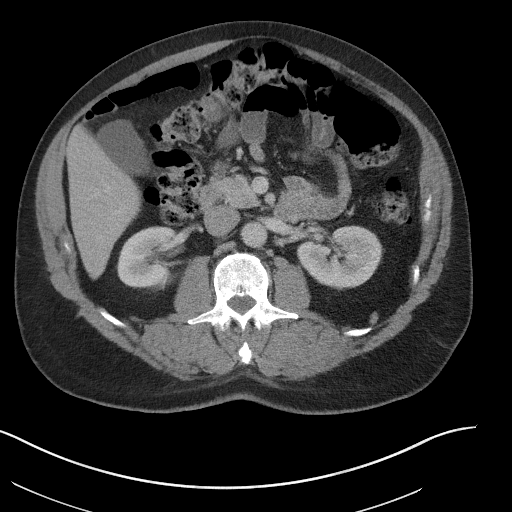
[im 69/100  soft-tissue]
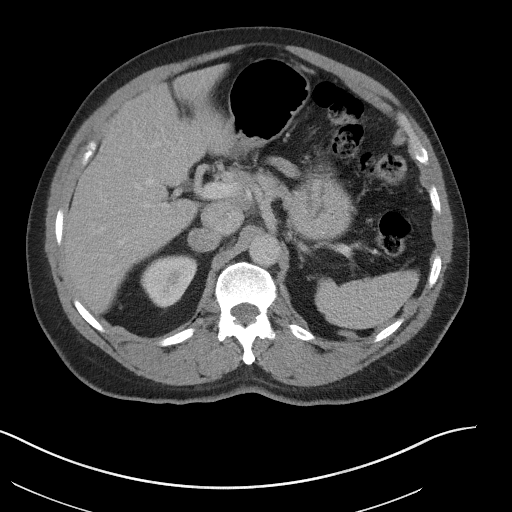
[im 69/100  bone]
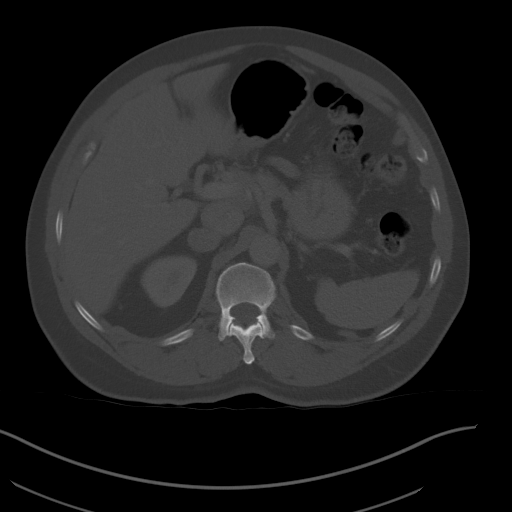
[im 77/100  soft-tissue]
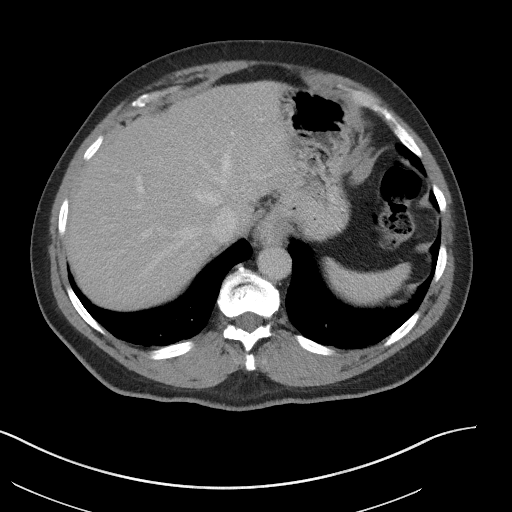
[im 84/100  soft-tissue]
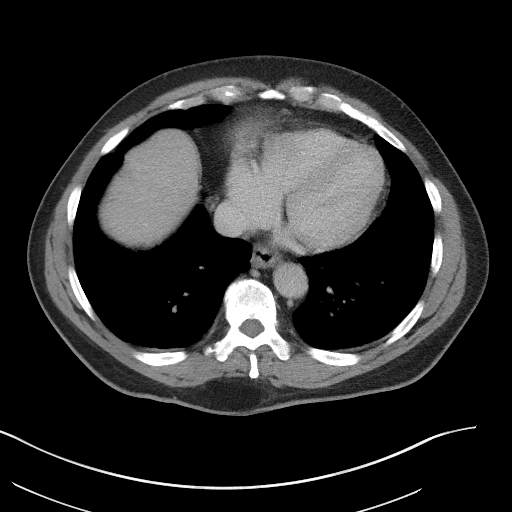
[im 92/100  soft-tissue]
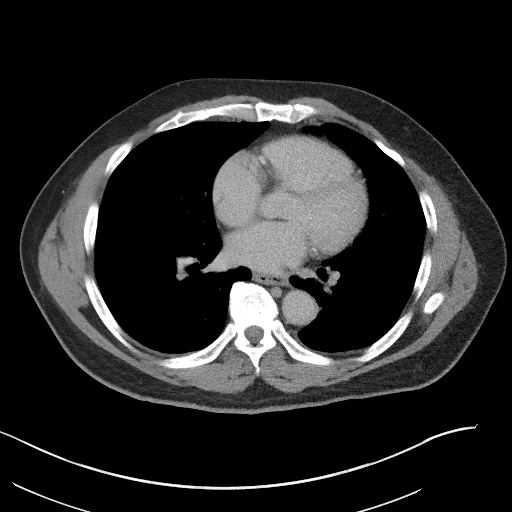

[Series 6: coronal st · coronal · 0.87mm/px · 3 of 117 slices shown]
[im 39/117  soft-tissue]
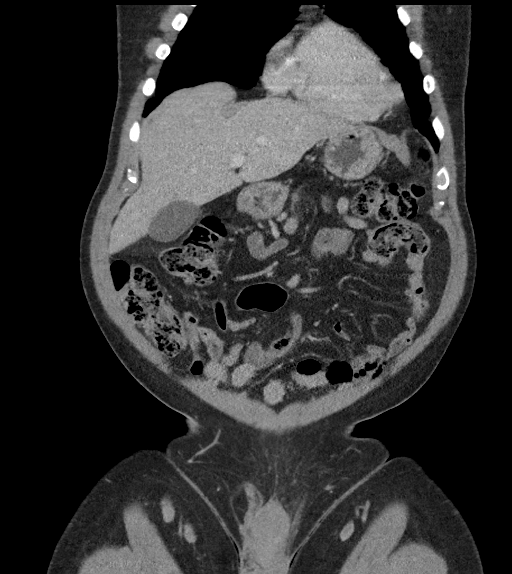
[im 52/117  soft-tissue]
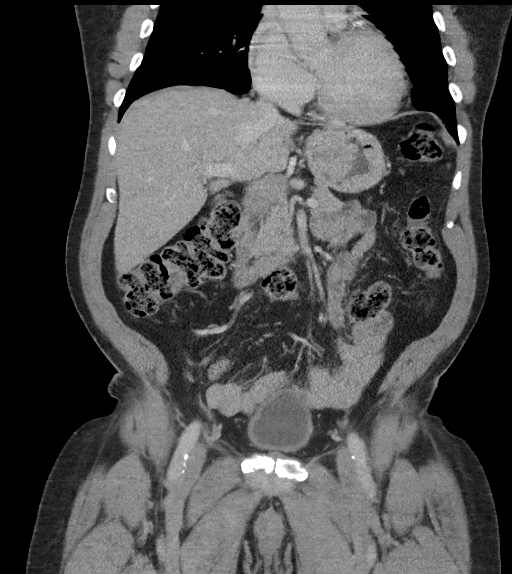
[im 65/117  soft-tissue]
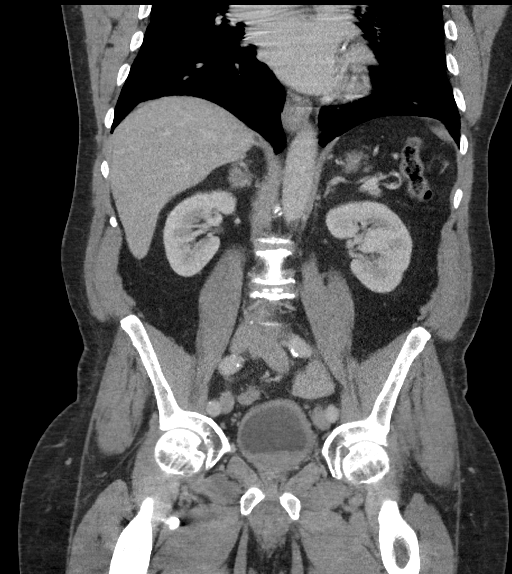

[15 of 46 positions shown; findings below may reference images not displayed]

FINDINGS: Motion degraded images.

Lower chest: Lung bases are clear.

Hepatobiliary: Liver is within normal limits.

Gallbladder is unremarkable. No intrahepatic or extrahepatic ductal
dilatation.

Pancreas: Within normal limits.

Spleen: Within normal limits.

Adrenals/Urinary Tract: 2.7 cm right adrenal nodule, unchanged,
likely reflecting a benign adrenal adenoma. Left adrenal glands are
within normal limits.

Kidneys are within normal limits.  No hydronephrosis.

Thick-walled bladder. Small left posterolateral bladder
diverticulum.

Stomach/Bowel: Stomach is notable for a small hiatal hernia.

No evidence of bowel obstruction.

Normal appendix (coronal image 50).

No colonic wall thickening or inflammatory changes.

Vascular/Lymphatic: No evidence of abdominal aortic aneurysm.

Atherosclerotic calcifications of the abdominal aorta and branch
vessels.

No suspicious abdominopelvic lymphadenopathy.

Reproductive: Fiducial markers in the prostate.

Other: No abdominopelvic ascites.

Postsurgical changes in the left inguinal canal. Fat in the right
inguinal canal.

Musculoskeletal: Degenerative changes of the visualized
thoracolumbar spine.

Status post ORIF of the right proximal femur.
IMPRESSION: No evidence of bowel obstruction.  Normal appendix.

Thick-walled bladder, correlate for cystitis.

Fiducial markers in the prostate in this patient with history of
prostate cancer. No findings suspicious for metastatic disease.

## 2020-08-23 ENCOUNTER — Ambulatory Visit: Payer: Medicare Other | Admitting: Urology

## 2020-08-23 ENCOUNTER — Encounter: Payer: Self-pay | Admitting: Urology

## 2020-08-23 ENCOUNTER — Other Ambulatory Visit: Payer: Self-pay

## 2020-08-23 ENCOUNTER — Ambulatory Visit (INDEPENDENT_AMBULATORY_CARE_PROVIDER_SITE_OTHER): Payer: Medicare Other | Admitting: Urology

## 2020-08-23 VITALS — BP 120/77 | HR 72 | Temp 98.5°F

## 2020-08-23 DIAGNOSIS — C61 Malignant neoplasm of prostate: Secondary | ICD-10-CM

## 2020-08-23 DIAGNOSIS — R351 Nocturia: Secondary | ICD-10-CM

## 2020-08-23 DIAGNOSIS — C775 Secondary and unspecified malignant neoplasm of intrapelvic lymph nodes: Secondary | ICD-10-CM | POA: Diagnosis not present

## 2020-08-23 DIAGNOSIS — N403 Nodular prostate with lower urinary tract symptoms: Secondary | ICD-10-CM | POA: Diagnosis not present

## 2020-08-23 DIAGNOSIS — R232 Flushing: Secondary | ICD-10-CM | POA: Diagnosis not present

## 2020-08-23 DIAGNOSIS — T50905A Adverse effect of unspecified drugs, medicaments and biological substances, initial encounter: Secondary | ICD-10-CM

## 2020-08-23 MED ORDER — LEUPROLIDE ACETATE (6 MONTH) 45 MG IM KIT: 45.0000 mg | PACK | Freq: Once | INTRAMUSCULAR | Status: DC

## 2020-08-23 NOTE — Progress Notes (Signed)
Urological Symptom Review  Patient is experiencing the following symptoms: Frequent urination Hard to postpone urination Get up at night to urinate Leakage of urine Weak stream Erection problems (male only)   Review of Systems  Gastrointestinal (upper)  : Negative for upper GI symptoms  Gastrointestinal (lower) : Negative for lower GI symptoms  Constitutional : Negative for symptoms  Skin: Negative for skin symptoms  Eyes: Negative for eye symptoms  Ear/Nose/Throat : Sinus problems  Hematologic/Lymphatic: Easy bruising  Cardiovascular : Leg swelling  Respiratory : Negative for respiratory symptoms  Endocrine: Negative for endocrine symptoms  Musculoskeletal: Negative for musculoskeletal symptoms  Neurological: Negative for neurological symptoms  Psychologic: Depression Anxiety

## 2020-08-23 NOTE — Progress Notes (Signed)
duplicate
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
## 2020-08-24 LAB — TESTOSTERONE: Testosterone: <3 ng/dL — ABNORMAL LOW (ref 264–916)

## 2020-08-24 LAB — PSA: Prostate Specific Ag, Serum: 1.7 ng/mL (ref 0.0–4.0)

## 2020-08-27 ENCOUNTER — Telehealth: Payer: Self-pay

## 2020-08-27 DIAGNOSIS — C61 Malignant neoplasm of prostate: Secondary | ICD-10-CM

## 2020-08-27 NOTE — Telephone Encounter (Signed)
Patient called and notified. Appt made with patient.

## 2020-08-27 NOTE — Telephone Encounter (Signed)
-----   Message from Irine Seal, MD sent at 08/24/2020  8:01 PM EDT ----- His PSA is up to 1.7 from 0.4 despite a castrate testosterone of <3.   I would like to get a repeat PSA and testosterone in 6 weeks to reassess prior to further evaluation. ----- Message ----- From: Dorisann Frames, RN Sent: 08/24/2020   7:59 AM EDT To: Irine Seal, MD  Please review

## 2020-09-18 ENCOUNTER — Other Ambulatory Visit: Payer: Self-pay

## 2020-09-18 DIAGNOSIS — N403 Nodular prostate with lower urinary tract symptoms: Secondary | ICD-10-CM

## 2020-09-18 MED ORDER — TAMSULOSIN HCL 0.4 MG PO CAPS: 0.4000 mg | ORAL_CAPSULE | Freq: Every evening | ORAL | 3 refills | Status: DC

## 2020-10-03 ENCOUNTER — Other Ambulatory Visit: Payer: Self-pay

## 2020-10-03 ENCOUNTER — Ambulatory Visit (HOSPITAL_COMMUNITY)
Admission: RE | Admit: 2020-10-03 | Discharge: 2020-10-03 | Disposition: A | Discharge status: Home / Self Care | Payer: Medicare Other | Source: Ambulatory Visit | Attending: Urology | Admitting: Urology

## 2020-10-03 DIAGNOSIS — Z87311 Personal history of (healed) other pathological fracture: Secondary | ICD-10-CM | POA: Insufficient documentation

## 2020-10-03 DIAGNOSIS — R29890 Loss of height: Secondary | ICD-10-CM | POA: Diagnosis not present

## 2020-10-03 DIAGNOSIS — C61 Malignant neoplasm of prostate: Secondary | ICD-10-CM | POA: Diagnosis not present

## 2020-10-03 DIAGNOSIS — Z13820 Encounter for screening for osteoporosis: Secondary | ICD-10-CM | POA: Insufficient documentation

## 2020-10-03 DIAGNOSIS — Z79818 Long term (current) use of other agents affecting estrogen receptors and estrogen levels: Secondary | ICD-10-CM | POA: Diagnosis not present

## 2020-10-03 DIAGNOSIS — Z79899 Other long term (current) drug therapy: Secondary | ICD-10-CM

## 2020-10-05 NOTE — Progress Notes (Signed)
Attempted to reach pt by phone. No success. Letter mailed.

## 2020-10-08 ENCOUNTER — Other Ambulatory Visit: Payer: Medicare Other

## 2020-10-25 ENCOUNTER — Ambulatory Visit: Payer: Medicare Other | Admitting: Urology

## 2021-02-14 ENCOUNTER — Other Ambulatory Visit: Payer: Medicare Other

## 2021-02-14 ENCOUNTER — Other Ambulatory Visit: Payer: Self-pay

## 2021-02-14 DIAGNOSIS — C61 Malignant neoplasm of prostate: Secondary | ICD-10-CM

## 2021-02-15 LAB — TESTOSTERONE: Testosterone: 42 ng/dL — ABNORMAL LOW (ref 264–916)

## 2021-02-15 LAB — PSA: Prostate Specific Ag, Serum: 11.2 ng/mL — ABNORMAL HIGH (ref 0.0–4.0)

## 2021-02-21 ENCOUNTER — Other Ambulatory Visit: Payer: Self-pay

## 2021-02-21 ENCOUNTER — Encounter: Payer: Self-pay | Admitting: Urology

## 2021-02-21 ENCOUNTER — Ambulatory Visit (INDEPENDENT_AMBULATORY_CARE_PROVIDER_SITE_OTHER): Payer: Medicare Other | Admitting: Urology

## 2021-02-21 VITALS — BP 139/78 | HR 84

## 2021-02-21 DIAGNOSIS — R7309 Other abnormal glucose: Secondary | ICD-10-CM | POA: Diagnosis not present

## 2021-02-21 DIAGNOSIS — H9193 Unspecified hearing loss, bilateral: Secondary | ICD-10-CM | POA: Diagnosis not present

## 2021-02-21 DIAGNOSIS — R9721 Rising PSA following treatment for malignant neoplasm of prostate: Secondary | ICD-10-CM | POA: Diagnosis not present

## 2021-02-21 DIAGNOSIS — R3915 Urgency of urination: Secondary | ICD-10-CM | POA: Diagnosis not present

## 2021-02-21 DIAGNOSIS — Z0001 Encounter for general adult medical examination with abnormal findings: Secondary | ICD-10-CM | POA: Diagnosis not present

## 2021-02-21 DIAGNOSIS — E7800 Pure hypercholesterolemia, unspecified: Secondary | ICD-10-CM | POA: Diagnosis not present

## 2021-02-21 DIAGNOSIS — C775 Secondary and unspecified malignant neoplasm of intrapelvic lymph nodes: Secondary | ICD-10-CM | POA: Diagnosis not present

## 2021-02-21 DIAGNOSIS — N403 Nodular prostate with lower urinary tract symptoms: Secondary | ICD-10-CM

## 2021-02-21 DIAGNOSIS — Z79899 Other long term (current) drug therapy: Secondary | ICD-10-CM | POA: Diagnosis not present

## 2021-02-21 DIAGNOSIS — R002 Palpitations: Secondary | ICD-10-CM | POA: Diagnosis not present

## 2021-02-21 DIAGNOSIS — C61 Malignant neoplasm of prostate: Secondary | ICD-10-CM | POA: Diagnosis not present

## 2021-02-21 NOTE — Progress Notes (Signed)
Urological Symptom Review  Patient is experiencing the following symptoms: Leakage of urine Weak stream Erection problems (male only)   Review of Systems  Gastrointestinal (upper)  : Negative for upper GI symptoms  Gastrointestinal (lower) : Negative for lower GI symptoms  Constitutional : Negative for symptoms  Skin: Negative for skin symptoms  Eyes: Blurred vision  Ear/Nose/Throat : Negative for Ear/Nose/Throat symptoms  Hematologic/Lymphatic: Negative for Hematologic/Lymphatic symptoms  Cardiovascular : Negative for cardiovascular symptoms  Respiratory : Negative for respiratory symptoms  Endocrine: Negative for endocrine symptoms  Musculoskeletal: Negative for musculoskeletal symptoms  Neurological: Negative for neurological symptoms  Psychologic: Negative for psychiatric symptoms
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
## 2021-03-11 ENCOUNTER — Other Ambulatory Visit: Payer: Self-pay | Admitting: Urology

## 2021-03-11 DIAGNOSIS — N403 Nodular prostate with lower urinary tract symptoms: Secondary | ICD-10-CM

## 2021-03-20 ENCOUNTER — Encounter (HOSPITAL_COMMUNITY)
Admission: RE | Admit: 2021-03-20 | Discharge: 2021-03-20 | Disposition: A | Discharge status: Home / Self Care | Payer: Medicare Other | Source: Ambulatory Visit | Attending: Urology | Admitting: Urology

## 2021-03-20 ENCOUNTER — Other Ambulatory Visit: Payer: Self-pay

## 2021-03-20 DIAGNOSIS — C775 Secondary and unspecified malignant neoplasm of intrapelvic lymph nodes: Secondary | ICD-10-CM | POA: Diagnosis not present

## 2021-03-20 DIAGNOSIS — N323 Diverticulum of bladder: Secondary | ICD-10-CM | POA: Diagnosis not present

## 2021-03-20 DIAGNOSIS — C61 Malignant neoplasm of prostate: Secondary | ICD-10-CM | POA: Insufficient documentation

## 2021-03-20 DIAGNOSIS — R590 Localized enlarged lymph nodes: Secondary | ICD-10-CM | POA: Diagnosis not present

## 2021-03-20 DIAGNOSIS — R9721 Rising PSA following treatment for malignant neoplasm of prostate: Secondary | ICD-10-CM | POA: Diagnosis present

## 2021-03-20 DIAGNOSIS — H903 Sensorineural hearing loss, bilateral: Secondary | ICD-10-CM | POA: Diagnosis not present

## 2021-03-20 MED ORDER — PIFLIFOLASTAT F 18 (PYLARIFY) INJECTION
9.0000 | Freq: Once | INTRAVENOUS | Status: AC
  Administered 2021-03-20: 8.4 via INTRAVENOUS

## 2021-03-26 ENCOUNTER — Telehealth: Payer: Self-pay

## 2021-03-26 NOTE — Telephone Encounter (Signed)
Patient was called- given results and notified.

## 2021-03-26 NOTE — Telephone Encounter (Signed)
-----   Message from Irine Seal, MD sent at 03/25/2021 10:00 AM EST ----- There are 3 areas of uptake in lymph nodes on the scan.   I will discuss this with him further on 3/9.  I will reach out to radiation oncology to see if there is any place for radiation in his scenario.  ----- Message ----- From: Dorisann Frames, RN Sent: 03/24/2021   7:39 PM EST To: Irine Seal, MD  Please review

## 2021-04-04 ENCOUNTER — Ambulatory Visit (INDEPENDENT_AMBULATORY_CARE_PROVIDER_SITE_OTHER): Payer: Medicare Other | Admitting: Urology

## 2021-04-04 ENCOUNTER — Other Ambulatory Visit: Payer: Self-pay

## 2021-04-04 VITALS — BP 124/74 | HR 57 | Ht 70.0 in | Wt 210.0 lb

## 2021-04-04 DIAGNOSIS — R9721 Rising PSA following treatment for malignant neoplasm of prostate: Secondary | ICD-10-CM | POA: Diagnosis not present

## 2021-04-04 DIAGNOSIS — C778 Secondary and unspecified malignant neoplasm of lymph nodes of multiple regions: Secondary | ICD-10-CM | POA: Diagnosis not present

## 2021-04-04 DIAGNOSIS — C61 Malignant neoplasm of prostate: Secondary | ICD-10-CM
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
## 2021-04-05 ENCOUNTER — Encounter: Payer: Self-pay | Admitting: Urology

## 2021-04-05 LAB — URINALYSIS, ROUTINE W REFLEX MICROSCOPIC
Bilirubin, UA: NEGATIVE
Glucose, UA: NEGATIVE
Ketones, UA: NEGATIVE
Leukocytes,UA: NEGATIVE
Nitrite, UA: NEGATIVE
Protein,UA: NEGATIVE
RBC, UA: NEGATIVE
Specific Gravity, UA: 1.015 (ref 1.005–1.030)
Urobilinogen, Ur: 0.2 mg/dL (ref 0.2–1.0)
pH, UA: 5.5 (ref 5.0–7.5)

## 2021-04-05 LAB — CBC
Hematocrit: 35.8 % — ABNORMAL LOW (ref 37.5–51.0)
Hemoglobin: 12.2 g/dL — ABNORMAL LOW (ref 13.0–17.7)
MCH: 31.8 pg (ref 26.6–33.0)
MCHC: 34.1 g/dL (ref 31.5–35.7)
MCV: 93 fL (ref 79–97)
Platelets: 203 10*3/uL (ref 150–450)
RBC: 3.84 x10E6/uL — ABNORMAL LOW (ref 4.14–5.80)
RDW: 12.9 % (ref 11.6–15.4)
WBC: 4.3 10*3/uL (ref 3.4–10.8)

## 2021-04-05 LAB — PSA: Prostate Specific Ag, Serum: 11.1 ng/mL — ABNORMAL HIGH (ref 0.0–4.0)

## 2021-04-05 LAB — TESTOSTERONE: Testosterone: 32 ng/dL — ABNORMAL LOW (ref 264–916)

## 2021-04-05 LAB — COMPREHENSIVE METABOLIC PANEL
ALT: 12 IU/L (ref 0–44)
AST: 18 IU/L (ref 0–40)
Albumin/Globulin Ratio: 1.7 (ref 1.2–2.2)
Albumin: 4 g/dL (ref 3.7–4.7)
Alkaline Phosphatase: 87 IU/L (ref 44–121)
BUN/Creatinine Ratio: 13 (ref 10–24)
BUN: 12 mg/dL (ref 8–27)
Bilirubin Total: 0.6 mg/dL (ref 0.0–1.2)
CO2: 25 mmol/L (ref 20–29)
Calcium: 9.1 mg/dL (ref 8.6–10.2)
Chloride: 103 mmol/L (ref 96–106)
Creatinine, Ser: 0.9 mg/dL (ref 0.76–1.27)
Globulin, Total: 2.4 g/dL (ref 1.5–4.5)
Glucose: 80 mg/dL (ref 70–99)
Potassium: 4.4 mmol/L (ref 3.5–5.2)
Sodium: 140 mmol/L (ref 134–144)
Total Protein: 6.4 g/dL (ref 6.0–8.5)
eGFR: 87 mL/min/{1.73_m2} (ref >59)

## 2021-04-05 NOTE — Progress Notes (Signed)
Letter sent.

## 2021-04-10 ENCOUNTER — Telehealth: Payer: Self-pay | Admitting: Oncology

## 2021-04-10 NOTE — Telephone Encounter (Signed)
Scheduled appt per 3/9 referral. Pt is aware of appt date and time. Pt is aware to arrive 15 mins prior to appt time and to bring and updated insurance card. Pt is aware of appt location.   ?

## 2021-04-16 ENCOUNTER — Ambulatory Visit: Payer: Medicare Other

## 2021-04-24 ENCOUNTER — Telehealth: Payer: Self-pay

## 2021-04-24 ENCOUNTER — Other Ambulatory Visit: Payer: Self-pay

## 2021-04-24 ENCOUNTER — Inpatient Hospital Stay: Payer: Medicare Other | Attending: Oncology | Admitting: Oncology

## 2021-04-24 ENCOUNTER — Other Ambulatory Visit (HOSPITAL_COMMUNITY): Payer: Self-pay

## 2021-04-24 VITALS — BP 135/71 | HR 70 | Temp 97.9°F | Resp 18 | Ht 70.0 in | Wt 218.1 lb

## 2021-04-24 DIAGNOSIS — C61 Malignant neoplasm of prostate: Secondary | ICD-10-CM

## 2021-04-24 DIAGNOSIS — Z808 Family history of malignant neoplasm of other organs or systems: Secondary | ICD-10-CM

## 2021-04-24 MED ORDER — XTANDI 40 MG PO CAPS
160.0000 mg | ORAL_CAPSULE | Freq: Every day | ORAL | 0 refills | Status: DC
  Filled 2021-04-24: qty 120, 30d supply, fill #0

## 2021-04-24 MED ORDER — ENZALUTAMIDE 40 MG PO TABS
160.0000 mg | ORAL_TABLET | Freq: Every day | ORAL | 0 refills | Status: DC
  Filled 2021-04-24 – 2021-05-01 (×2): qty 120, 30d supply, fill #0

## 2021-04-24 NOTE — Telephone Encounter (Signed)
Oral Oncology Patient Advocate Encounter ? ?Prior Authorization for [name] has been approved.   ? ?PA# [id number] ?Effective dates: 04/24/21 through 01/26/22 ? ?Patients co-pay is $0 ? ?Oral Oncology Clinic will continue to follow.  ? ?Wynn Maudlin CPHT ?Specialty Pharmacy Patient Advocate ?[place] ?Phone [phone] ?Fax [phone] ?04/24/2021 3:17 PM ? ?

## 2021-04-24 NOTE — Telephone Encounter (Signed)
Oral Oncology Patient Advocate Encounter ?  ?Received notification from [facility] that prior authorization for [name] is required. ?  ?PA submitted on CoverMyMeds ?Key [id number] ?Status is pending ?  ?Oral Oncology Clinic will continue to follow. ? ?Wynn Maudlin CPHT ?Specialty Pharmacy Patient Advocate ?[place] ?Phone [phone] ?Fax [phone] ?04/24/2021 3:02 PM ? ? ?

## 2021-04-24 NOTE — Telephone Encounter (Signed)
Oral Oncology Pharmacist Encounter ? ?Received new prescription for enzalutamide [name]) for the treatment of prostate cancer, planned duration until disease progression or unacceptable toxicity. Prescription dose and duration assessed. ? ?Labs from 04/04/21 assessed, no interventions needed. ? ?Current medication list in Epic reviewed, DDIs with xtandi identified: none ? ?Evaluated chart and no patient barriers to medication adherence noted.  ? ?Patient agreement for treatment documented in MD note on 04/24/2021. ? ?Prescription has been e-scribed to the [facility] for benefits analysis and approval. ? ?Oral Oncology Clinic will continue to follow for insurance authorization, copayment issues, initial counseling and start date. ? ?Drema Halon, PharmD ?Hematology/Oncology Clinical Pharmacist ?[facility] ?[phone] ?04/24/2021 2:49 PM ? ? ?
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
## 2021-04-25 NOTE — Telephone Encounter (Signed)
Oral Chemotherapy Pharmacist Encounter ? ?I spoke with patient for overview of: Xtandi for the treatment of metastatic, castration-resistant prostate cancer, planned duration until disease progression or unacceptable toxicity.  ? ?Counseled patient on administration, dosing, side effects, monitoring, drug-food interactions, safe handling, storage, and disposal. ? ?Patient will take Xtandi '40mg'$  capsules, 4 capsules ('160mg'$ ) by mouth once daily without regard to food. ? ?Xtandi start date: 05/03/2021 ? ?Adverse effects include but are not limited to: peripheral edema, GI upset, hypertension, hot flashes, fatigue, falls/fractures, and arthralgias.   ?Patient instructed about small risk of seizures with Xtandi treatment. ? ?Reviewed with patient importance of keeping a medication schedule and plan for any missed doses. No barriers to medication adherence identified. ? ?Medication reconciliation performed and medication/allergy list updated. ? ?Ship broker for [name] has been obtained. ?Test claim at the pharmacy revealed copayment $0 for 1st fill of 30 days. ?This will ship from the [facility] on 05/01/2021 to deliver to patient's home on 05/02/2021. ? ?Patient informed the pharmacy will reach out 5-7 days prior to needing next fill of Xtandi to coordinate continued medication acquisition to prevent break in therapy. ? ?All questions answered. ? ?Troy Walter voiced understanding and appreciation.  ? ?Medication education handout placed in mail for patient. Patient knows to call the office with questions or concerns. Oral Chemotherapy Clinic phone number provided to patient.  ? ?Drema Halon, PharmD ?Hematology/Oncology Clinical Pharmacist ?[facility] ?[phone] ?04/25/2021   10:01 AM ?

## 2021-04-25 NOTE — Progress Notes (Signed)
RN attempted to call patient to introduce self as prostate nurse navigator.  ? ?Mailbox is currently full.  Will attempt again at later time.  ?

## 2021-05-01 ENCOUNTER — Other Ambulatory Visit (HOSPITAL_COMMUNITY): Payer: Self-pay

## 2021-05-17 ENCOUNTER — Other Ambulatory Visit (HOSPITAL_COMMUNITY): Payer: Self-pay

## 2021-05-20 ENCOUNTER — Other Ambulatory Visit (HOSPITAL_COMMUNITY): Payer: Self-pay

## 2021-05-21 ENCOUNTER — Other Ambulatory Visit (HOSPITAL_COMMUNITY): Payer: Self-pay

## 2021-05-23 ENCOUNTER — Other Ambulatory Visit (HOSPITAL_COMMUNITY): Payer: Self-pay

## 2021-05-23 ENCOUNTER — Ambulatory Visit (INDEPENDENT_AMBULATORY_CARE_PROVIDER_SITE_OTHER): Payer: Medicare Other | Admitting: Urology

## 2021-05-23 VITALS — BP 121/73 | HR 61

## 2021-05-23 DIAGNOSIS — N403 Nodular prostate with lower urinary tract symptoms: Secondary | ICD-10-CM | POA: Diagnosis not present

## 2021-05-23 DIAGNOSIS — C61 Malignant neoplasm of prostate: Secondary | ICD-10-CM | POA: Diagnosis not present

## 2021-05-23 DIAGNOSIS — R351 Nocturia: Secondary | ICD-10-CM | POA: Diagnosis not present

## 2021-05-23 DIAGNOSIS — R3915 Urgency of urination: Secondary | ICD-10-CM | POA: Diagnosis not present

## 2021-05-23 DIAGNOSIS — C778 Secondary and unspecified malignant neoplasm of lymph nodes of multiple regions: Secondary | ICD-10-CM | POA: Diagnosis not present

## 2021-05-23 MED ORDER — LEUPROLIDE ACETATE (6 MONTH) 45 MG IM KIT: 45.0000 mg | PACK | Freq: Once | INTRAMUSCULAR | Status: DC

## 2021-05-23 MED ORDER — LEUPROLIDE ACETATE (6 MONTH) 45 MG ~~LOC~~ KIT: 45.0000 mg | PACK | Freq: Once | SUBCUTANEOUS | Status: DC
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
## 2021-05-24 DIAGNOSIS — N1832 Chronic kidney disease, stage 3b: Secondary | ICD-10-CM | POA: Diagnosis not present

## 2021-05-26 ENCOUNTER — Encounter: Payer: Self-pay | Admitting: Urology

## 2021-05-27 ENCOUNTER — Other Ambulatory Visit (HOSPITAL_COMMUNITY): Payer: Self-pay

## 2021-06-03 ENCOUNTER — Other Ambulatory Visit (HOSPITAL_COMMUNITY): Payer: Self-pay

## 2021-06-03 ENCOUNTER — Other Ambulatory Visit: Payer: Self-pay | Admitting: *Deleted

## 2021-06-03 ENCOUNTER — Other Ambulatory Visit: Payer: Self-pay | Admitting: Oncology

## 2021-06-03 DIAGNOSIS — C61 Malignant neoplasm of prostate: Secondary | ICD-10-CM

## 2021-06-03 MED ORDER — ENZALUTAMIDE 40 MG PO TABS
160.0000 mg | ORAL_TABLET | Freq: Every day | ORAL | 0 refills | Status: DC
  Filled 2021-06-03: qty 120, 30d supply, fill #0

## 2021-06-03 MED ORDER — ENZALUTAMIDE 40 MG PO TABS
160.0000 mg | ORAL_TABLET | Freq: Every day | ORAL | 0 refills | Status: DC
  Filled 2021-06-06: qty 120, 30d supply, fill #0

## 2021-06-06 ENCOUNTER — Other Ambulatory Visit (HOSPITAL_COMMUNITY): Payer: Self-pay

## 2021-06-11 ENCOUNTER — Telehealth: Payer: Self-pay | Admitting: Oncology

## 2021-06-11 NOTE — Telephone Encounter (Signed)
Called patient's spouse, patient will be notified of upcoming appointments. ?

## 2021-06-12 ENCOUNTER — Telehealth: Payer: Self-pay | Admitting: *Deleted

## 2021-06-12 ENCOUNTER — Inpatient Hospital Stay: Payer: Medicare Other

## 2021-06-12 ENCOUNTER — Inpatient Hospital Stay: Payer: Medicare Other | Admitting: Oncology

## 2021-06-12 ENCOUNTER — Other Ambulatory Visit (HOSPITAL_COMMUNITY): Payer: Self-pay

## 2021-06-12 NOTE — Telephone Encounter (Signed)
Returned PC to patient's wife, she said they missed his appointments today because of transportation issues.  She is asking if they can come tommorow - per Dr. Alen Blew, he can see this patient tomorrow at 1:30 with a lab appointment prior.  Patient's wife verbalizes understanding, high priority scheduling message sent. ?

## 2021-06-13 ENCOUNTER — Other Ambulatory Visit: Payer: Self-pay

## 2021-06-13 ENCOUNTER — Inpatient Hospital Stay: Payer: Medicare Other

## 2021-06-13 ENCOUNTER — Other Ambulatory Visit (HOSPITAL_COMMUNITY): Payer: Self-pay

## 2021-06-13 ENCOUNTER — Inpatient Hospital Stay: Payer: Medicare Other | Attending: Oncology | Admitting: Oncology

## 2021-06-13 DIAGNOSIS — C61 Malignant neoplasm of prostate: Secondary | ICD-10-CM | POA: Diagnosis present

## 2021-06-13 DIAGNOSIS — Z923 Personal history of irradiation: Secondary | ICD-10-CM | POA: Diagnosis not present

## 2021-06-13 LAB — CBC WITH DIFFERENTIAL (CANCER CENTER ONLY)
Abs Immature Granulocytes: 0.01 10*3/uL (ref 0.00–0.07)
Basophils Absolute: 0 10*3/uL (ref 0.0–0.1)
Basophils Relative: 1 %
Eosinophils Absolute: 0.5 10*3/uL (ref 0.0–0.5)
Eosinophils Relative: 10 %
HCT: 37.1 % — ABNORMAL LOW (ref 39.0–52.0)
Hemoglobin: 12.8 g/dL — ABNORMAL LOW (ref 13.0–17.0)
Immature Granulocytes: 0 %
Lymphocytes Relative: 43 %
Lymphs Abs: 2 10*3/uL (ref 0.7–4.0)
MCH: 32.2 pg (ref 26.0–34.0)
MCHC: 34.5 g/dL (ref 30.0–36.0)
MCV: 93.5 fL (ref 80.0–100.0)
Monocytes Absolute: 0.4 10*3/uL (ref 0.1–1.0)
Monocytes Relative: 9 %
Neutro Abs: 1.8 10*3/uL (ref 1.7–7.7)
Neutrophils Relative %: 37 %
Platelet Count: 200 10*3/uL (ref 150–400)
RBC: 3.97 MIL/uL — ABNORMAL LOW (ref 4.22–5.81)
RDW: 12.7 % (ref 11.5–15.5)
WBC Count: 4.7 10*3/uL (ref 4.0–10.5)
nRBC: 0 % (ref 0.0–0.2)

## 2021-06-13 LAB — CMP (CANCER CENTER ONLY)
ALT: 14 U/L (ref 0–44)
AST: 23 U/L (ref 15–41)
Albumin: 4.1 g/dL (ref 3.5–5.0)
Alkaline Phosphatase: 84 U/L (ref 38–126)
Anion gap: 5 (ref 5–15)
BUN: 14 mg/dL (ref 8–23)
CO2: 29 mmol/L (ref 22–32)
Calcium: 9.6 mg/dL (ref 8.9–10.3)
Chloride: 103 mmol/L (ref 98–111)
Creatinine: 1.12 mg/dL (ref 0.61–1.24)
GFR, Estimated: >60 mL/min (ref >60)
Glucose, Bld: 104 mg/dL — ABNORMAL HIGH (ref 70–99)
Potassium: 4.3 mmol/L (ref 3.5–5.1)
Sodium: 137 mmol/L (ref 135–145)
Total Bilirubin: 0.9 mg/dL (ref 0.3–1.2)
Total Protein: 7.7 g/dL (ref 6.5–8.1)

## 2021-06-13 MED ORDER — ENZALUTAMIDE 40 MG PO TABS
160.0000 mg | ORAL_TABLET | Freq: Every day | ORAL | 3 refills | Status: DC
  Filled 2021-06-13 – 2021-06-25 (×2): qty 120, 30d supply, fill #0
  Filled 2021-07-31: qty 120, 30d supply, fill #1
  Filled 2021-08-23: qty 120, 30d supply, fill #2
  Filled 2021-09-20: qty 120, 30d supply, fill #3

## 2021-06-13 NOTE — Progress Notes (Signed)
Hematology and Oncology Follow Up Visit  Troy Walter [id number] 1945/06/18 76 y.o. 06/13/2021 1:43 PM Koirala, Dibas, MDKoirala, Dibas, MD   Principle Diagnosis: 76 year old with prostate cancer diagnosed in 2019.  He was found to have a PSA of 13.5, Gleason score 9.  He has castration-resistant advanced disease with lymphadenopathy in March 2023.   Prior Therapy: He is status post androgen deprivation therapy and external beam radiation completed in 2020.  His PSA was down to 0.4 in January 2022 with testosterone is at a castrate level.    PSA started to rise in July 2022 was up to 1.7.  In January 2023 was 11.2 and it was confirmed on April 04, 2021 was 11.1.  His testosterone level was 32 in March 2023.  Based on these findings he underwent PSMA PET scan which showed metastatic uptake and avid adenopathy in the periaortic, retroperitoneal and possible mediastinum and supraclavicular nodal areas   Current therapy:  Xtandi 160 mg daily started in April 2023.  Eligard 45 mg every 6 months under the care of Dr. Jeffie Walter.  This will be given next of his testosterone level increases.  Interim History: Troy Walter returns today for a follow-up visit.  Since last visit, he started taking Xtandi without any major complaints.  He has tolerated it reasonably well.  He does report some fatigue and hot flashes but otherwise the symptoms are manageable.  He denies any bone pain or pathological fractures.  He denies any headaches or seizures.  Pulm status quality of life remains unchanged.   does not report any headaches, blurry vision, syncope or seizures. Does not report any fevers, chills or sweats.  Does not report any cough, wheezing or hemoptysis.  Does not report any chest pain, palpitation, orthopnea or leg edema.  Does not report any nausea, vomiting or abdominal pain.  Does not report any constipation or diarrhea.  Does not report any skeletal complaints.    Does not report frequency, urgency or  hematuria.  Does not report any skin rashes or lesions. Does not report any heat or cold intolerance.  Does not report any lymphadenopathy or petechiae.  Does not report any anxiety or depression.  Remaining review of systems is negative.    Medications: I have reviewed the patient's current medications.  Current Outpatient Medications  Medication Sig Dispense Refill   Calcium Carbonate (CALCIUM 500 PO) 1 tablet '1200mg'$      enzalutamide (XTANDI) 40 MG tablet Take 4 tablets (160 mg total) by mouth daily. 120 tablet 3   ibuprofen (ADVIL) 800 MG tablet Take 800 mg by mouth every 8 (eight) hours as needed for moderate pain.     POTASSIUM CHLORIDE ER PO 1 tablet     tamsulosin (FLOMAX) 0.4 MG CAPS capsule TAKE 1 CAPSULE(0.4 MG) BY MOUTH EVERY EVENING 90 capsule 3   No current facility-administered medications for this visit.     Allergies: No Known Allergies    Physical Exam: Blood pressure 136/73, pulse 65, temperature 97.7 F (36.5 C), temperature source Temporal, resp. rate 18, height '5\' 10"'$  (1.778 m), weight 213 lb 12.8 oz (97 kg), SpO2 98 %. ECOG: 1   General appearance: Comfortable appearing without any discomfort Head: Normocephalic without any trauma Oropharynx: Mucous membranes are moist and pink without any thrush or ulcers. Eyes: Pupils are equal and round reactive to light. Lymph nodes: No cervical, supraclavicular, inguinal or axillary lymphadenopathy.   Heart:regular rate and rhythm.  S1 and S2 without leg edema. Lung: Clear without  any rhonchi or wheezes.  No dullness to percussion. Abdomin: Soft, nontender, nondistended with good bowel sounds.  No hepatosplenomegaly. Musculoskeletal: No joint deformity or effusion.  Full range of motion noted. Neurological: No deficits noted on motor, sensory and deep tendon reflex exam. Skin: No petechial rash or dryness.  Appeared moist.     Lab Results: Lab Results  Component Value Date   WBC 4.7 06/13/2021   HGB 12.8 (L)  06/13/2021   HCT 37.1 (L) 06/13/2021   MCV 93.5 06/13/2021   PLT 200 06/13/2021     Chemistry      Component Value Date/Time   NA 137 06/13/2021 1253   NA 140 04/04/2021 1354   K 4.3 06/13/2021 1253   CL 103 06/13/2021 1253   CO2 29 06/13/2021 1253   BUN 14 06/13/2021 1253   BUN 12 04/04/2021 1354   CREATININE 1.12 06/13/2021 1253      Component Value Date/Time   CALCIUM 9.6 06/13/2021 1253   ALKPHOS 84 06/13/2021 1253   AST 23 06/13/2021 1253   ALT 14 06/13/2021 1253   BILITOT 0.9 06/13/2021 1253       Impression and Plan:   76 year old with:    1.  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    She is currently on Xtandi which she has tolerated very well except for a predictable side effects including fatigue and hot flashes.  Risks and benefits were reiterated at this time.  Complications including increased fatigue, hypertension and rarely seizures were reiterated.  Alternative treatment options such as systemic chemotherapy or a PARP inhibitor could also be considered.   2.  Androgen deprivation: He is currently receiving that under the care of Dr. Jeffie Walter.  Next injection will be given at visit testosterone levels above 50.  3.  Bone health: Recommended calcium and vitamin D supplements.   4.  Follow-up will be in 2 months for repeat evaluation.   30  minutes were spent on this encounter.  The time was dedicated to reviewing laboratory data, disease status update and outlining future plan of care discussion.  Zola Button, MD 5/18/20231:43 PM

## 2021-06-14 ENCOUNTER — Telehealth: Payer: Self-pay | Admitting: *Deleted

## 2021-06-14 LAB — PROSTATE-SPECIFIC AG, SERUM (LABCORP): Prostate Specific Ag, Serum: 1.9 ng/mL (ref 0.0–4.0)

## 2021-06-14 NOTE — Telephone Encounter (Signed)
-----   Message from Wyatt Portela, MD sent at 06/14/2021  8:56 AM EDT ----- Please let him know his PSA is down

## 2021-06-14 NOTE — Telephone Encounter (Signed)
PC to patient, spoke with his wife Butch Penny - informed her his PSA is down to 1.9, she verbalizes understanding.

## 2021-06-21 ENCOUNTER — Other Ambulatory Visit (HOSPITAL_COMMUNITY): Payer: Self-pay

## 2021-06-25 ENCOUNTER — Other Ambulatory Visit (HOSPITAL_COMMUNITY): Payer: Self-pay

## 2021-07-01 ENCOUNTER — Other Ambulatory Visit (HOSPITAL_COMMUNITY): Payer: Self-pay

## 2021-07-02 ENCOUNTER — Other Ambulatory Visit (HOSPITAL_COMMUNITY): Payer: Self-pay

## 2021-07-12 ENCOUNTER — Telehealth: Payer: Self-pay | Admitting: Oncology

## 2021-07-12 NOTE — Telephone Encounter (Signed)
Called patient regarding upcoming July appointment, patient's voicemail is full. Calender will be mailed.

## 2021-07-15 ENCOUNTER — Other Ambulatory Visit: Payer: Self-pay | Admitting: Urology

## 2021-07-15 DIAGNOSIS — N403 Nodular prostate with lower urinary tract symptoms: Secondary | ICD-10-CM

## 2021-07-18 ENCOUNTER — Other Ambulatory Visit: Payer: Medicare Other

## 2021-07-24 ENCOUNTER — Other Ambulatory Visit (HOSPITAL_COMMUNITY): Payer: Self-pay

## 2021-07-25 ENCOUNTER — Ambulatory Visit: Payer: Medicare Other | Admitting: Urology

## 2021-07-29 ENCOUNTER — Other Ambulatory Visit (HOSPITAL_COMMUNITY): Payer: Self-pay

## 2021-07-31 ENCOUNTER — Other Ambulatory Visit (HOSPITAL_COMMUNITY): Payer: Self-pay

## 2021-08-01 ENCOUNTER — Other Ambulatory Visit (HOSPITAL_COMMUNITY): Payer: Self-pay

## 2021-08-13 ENCOUNTER — Inpatient Hospital Stay: Payer: Medicare Other | Admitting: Oncology

## 2021-08-13 ENCOUNTER — Inpatient Hospital Stay: Payer: Medicare Other | Attending: Oncology

## 2021-08-15 ENCOUNTER — Telehealth: Payer: Self-pay

## 2021-08-15 NOTE — Telephone Encounter (Signed)
RN reached out to patient to assess if he would be interested in pursing genetic testing due to history of metastatic prostate cancer.   Voicemail box full.

## 2021-08-19 NOTE — Telephone Encounter (Signed)
T/C from pt stating he would like to hear more about the genetic testing.  He will be out of town on 7/31 and asked if someone could call him on Tuesday 8/1.

## 2021-08-20 ENCOUNTER — Other Ambulatory Visit (HOSPITAL_COMMUNITY): Payer: Self-pay

## 2021-08-21 ENCOUNTER — Other Ambulatory Visit (HOSPITAL_COMMUNITY): Payer: Self-pay

## 2021-08-23 ENCOUNTER — Other Ambulatory Visit (HOSPITAL_COMMUNITY): Payer: Self-pay

## 2021-08-27 ENCOUNTER — Other Ambulatory Visit (HOSPITAL_COMMUNITY): Payer: Self-pay

## 2021-08-27 DIAGNOSIS — C61 Malignant neoplasm of prostate: Secondary | ICD-10-CM

## 2021-08-28 ENCOUNTER — Telehealth: Payer: Self-pay | Admitting: Oncology

## 2021-08-28 NOTE — Telephone Encounter (Signed)
Per 8/2 in basket, attempted to call but voicemail full, will send calender

## 2021-09-12 ENCOUNTER — Ambulatory Visit: Payer: Medicare Other | Admitting: Urology

## 2021-09-16 ENCOUNTER — Other Ambulatory Visit (HOSPITAL_COMMUNITY): Payer: Self-pay

## 2021-09-19 ENCOUNTER — Ambulatory Visit (INDEPENDENT_AMBULATORY_CARE_PROVIDER_SITE_OTHER): Payer: Medicare Other | Admitting: Urology

## 2021-09-19 ENCOUNTER — Encounter: Payer: Self-pay | Admitting: Urology

## 2021-09-19 VITALS — BP 149/83 | HR 56

## 2021-09-19 DIAGNOSIS — C61 Malignant neoplasm of prostate: Secondary | ICD-10-CM

## 2021-09-19 DIAGNOSIS — N403 Nodular prostate with lower urinary tract symptoms: Secondary | ICD-10-CM

## 2021-09-19 DIAGNOSIS — R3915 Urgency of urination: Secondary | ICD-10-CM

## 2021-09-19 DIAGNOSIS — N138 Other obstructive and reflux uropathy: Secondary | ICD-10-CM | POA: Diagnosis not present

## 2021-09-19 DIAGNOSIS — N3941 Urge incontinence: Secondary | ICD-10-CM

## 2021-09-19 DIAGNOSIS — C778 Secondary and unspecified malignant neoplasm of lymph nodes of multiple regions: Secondary | ICD-10-CM | POA: Diagnosis not present

## 2021-09-19 LAB — URINALYSIS, ROUTINE W REFLEX MICROSCOPIC
Bilirubin, UA: NEGATIVE
Glucose, UA: NEGATIVE
Ketones, UA: NEGATIVE
Leukocytes,UA: NEGATIVE
Nitrite, UA: NEGATIVE
Protein,UA: NEGATIVE
RBC, UA: NEGATIVE
Specific Gravity, UA: 1.02 (ref 1.005–1.030)
Urobilinogen, Ur: 0.2 mg/dL (ref 0.2–1.0)
pH, UA: 5 (ref 5.0–7.5)
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
## 2021-09-20 ENCOUNTER — Other Ambulatory Visit (HOSPITAL_COMMUNITY): Payer: Self-pay

## 2021-09-20 LAB — COMPREHENSIVE METABOLIC PANEL
ALT: 11 IU/L (ref 0–44)
AST: 12 IU/L (ref 0–40)
Albumin/Globulin Ratio: 1.5 (ref 1.2–2.2)
Albumin: 4.2 g/dL (ref 3.8–4.8)
Alkaline Phosphatase: 101 IU/L (ref 44–121)
BUN/Creatinine Ratio: 15 (ref 10–24)
BUN: 15 mg/dL (ref 8–27)
Bilirubin Total: 0.7 mg/dL (ref 0.0–1.2)
CO2: 23 mmol/L (ref 20–29)
Calcium: 9.5 mg/dL (ref 8.6–10.2)
Chloride: 101 mmol/L (ref 96–106)
Creatinine, Ser: 1 mg/dL (ref 0.76–1.27)
Globulin, Total: 2.8 g/dL (ref 1.5–4.5)
Glucose: 95 mg/dL (ref 70–99)
Potassium: 4.5 mmol/L (ref 3.5–5.2)
Sodium: 138 mmol/L (ref 134–144)
Total Protein: 7 g/dL (ref 6.0–8.5)
eGFR: 77 mL/min/{1.73_m2} (ref >59)

## 2021-09-20 LAB — CBC
Hematocrit: 38.3 % (ref 37.5–51.0)
Hemoglobin: 13.1 g/dL (ref 13.0–17.7)
MCH: 32.3 pg (ref 26.6–33.0)
MCHC: 34.2 g/dL (ref 31.5–35.7)
MCV: 95 fL (ref 79–97)
Platelets: 218 10*3/uL (ref 150–450)
RBC: 4.05 x10E6/uL — ABNORMAL LOW (ref 4.14–5.80)
RDW: 12.9 % (ref 11.6–15.4)
WBC: 4.6 10*3/uL (ref 3.4–10.8)

## 2021-09-20 LAB — PSA: Prostate Specific Ag, Serum: 2 ng/mL (ref 0.0–4.0)

## 2021-09-20 LAB — TESTOSTERONE: Testosterone: 95 ng/dL — ABNORMAL LOW (ref 264–916)

## 2021-09-23 ENCOUNTER — Telehealth: Payer: Self-pay

## 2021-09-23 NOTE — Telephone Encounter (Signed)
-----   Message from Irine Seal, MD sent at 09/20/2021  4:06 PM EDT ----- His PSA is minimally increased at 2 but his testosterone is up to 95 with <50 being therapeutic.  I think we are going to need to get him started back on Lupron '45mg'$  IM q6 months if he is agreeable to that.  If not, I would like him back in 3 months with a PSA and testosterone level.  ----- Message ----- From: Audie Box, CMA Sent: 09/20/2021  11:12 AM EDT To: Irine Seal, MD  Please review, next f/u 12/28

## 2021-09-23 NOTE — Telephone Encounter (Signed)
Tried to contact patient at both numbers, no answer and no way to leave a voicemail.  Are you able to try to reach the patient with Dr. Ralene Muskrat recommendations.

## 2021-09-26 ENCOUNTER — Other Ambulatory Visit: Payer: Self-pay

## 2021-09-26 ENCOUNTER — Telehealth: Payer: Self-pay

## 2021-09-26 NOTE — Progress Notes (Signed)
Opened in error

## 2021-09-26 NOTE — Telephone Encounter (Signed)
FYI unable to reach patient after multiple attempts at both numbers.  Moved his scheduled apt up a month for the 3 month f/u with labs prior.  Apt reminder mailed to the patient.

## 2021-09-26 NOTE — Telephone Encounter (Signed)
-----   Message from Irine Seal, MD sent at 09/20/2021  4:06 PM EDT ----- His PSA is minimally increased at 2 but his testosterone is up to 95 with <50 being therapeutic.  I think we are going to need to get him started back on Lupron '45mg'$  IM q6 months if he is agreeable to that.  If not, I would like him back in 3 months with a PSA and testosterone level.  ----- Message ----- From: Audie Box, CMA Sent: 09/20/2021  11:12 AM EDT To: Irine Seal, MD  Please review, next f/u 12/28

## 2021-09-27 ENCOUNTER — Other Ambulatory Visit (HOSPITAL_COMMUNITY): Payer: Self-pay

## 2021-10-02 ENCOUNTER — Other Ambulatory Visit: Payer: Self-pay

## 2021-10-02 ENCOUNTER — Inpatient Hospital Stay: Payer: Medicare Other | Attending: Oncology

## 2021-10-02 ENCOUNTER — Telehealth: Payer: Self-pay

## 2021-10-02 ENCOUNTER — Inpatient Hospital Stay (HOSPITAL_BASED_OUTPATIENT_CLINIC_OR_DEPARTMENT_OTHER): Payer: Medicare Other | Admitting: Oncology

## 2021-10-02 VITALS — BP 132/75 | HR 58 | Temp 97.8°F | Resp 17 | Ht 70.0 in | Wt 221.3 lb

## 2021-10-02 DIAGNOSIS — C61 Malignant neoplasm of prostate: Secondary | ICD-10-CM | POA: Diagnosis present

## 2021-10-02 LAB — CBC WITH DIFFERENTIAL (CANCER CENTER ONLY)
Abs Immature Granulocytes: 0.01 10*3/uL (ref 0.00–0.07)
Basophils Absolute: 0 10*3/uL (ref 0.0–0.1)
Basophils Relative: 1 %
Eosinophils Absolute: 0.4 10*3/uL (ref 0.0–0.5)
Eosinophils Relative: 9 %
HCT: 34.5 % — ABNORMAL LOW (ref 39.0–52.0)
Hemoglobin: 12.1 g/dL — ABNORMAL LOW (ref 13.0–17.0)
Immature Granulocytes: 0 %
Lymphocytes Relative: 35 %
Lymphs Abs: 1.5 10*3/uL (ref 0.7–4.0)
MCH: 32.4 pg (ref 26.0–34.0)
MCHC: 35.1 g/dL (ref 30.0–36.0)
MCV: 92.5 fL (ref 80.0–100.0)
Monocytes Absolute: 0.4 10*3/uL (ref 0.1–1.0)
Monocytes Relative: 9 %
Neutro Abs: 2 10*3/uL (ref 1.7–7.7)
Neutrophils Relative %: 46 %
Platelet Count: 199 10*3/uL (ref 150–400)
RBC: 3.73 MIL/uL — ABNORMAL LOW (ref 4.22–5.81)
RDW: 13.1 % (ref 11.5–15.5)
WBC Count: 4.4 10*3/uL (ref 4.0–10.5)
nRBC: 0 % (ref 0.0–0.2)

## 2021-10-02 LAB — CMP (CANCER CENTER ONLY)
ALT: 14 U/L (ref 0–44)
AST: 18 U/L (ref 15–41)
Albumin: 3.8 g/dL (ref 3.5–5.0)
Alkaline Phosphatase: 74 U/L (ref 38–126)
Anion gap: 5 (ref 5–15)
BUN: 19 mg/dL (ref 8–23)
CO2: 25 mmol/L (ref 22–32)
Calcium: 9.5 mg/dL (ref 8.9–10.3)
Chloride: 109 mmol/L (ref 98–111)
Creatinine: 1.04 mg/dL (ref 0.61–1.24)
GFR, Estimated: >60 mL/min (ref >60)
Glucose, Bld: 81 mg/dL (ref 70–99)
Potassium: 4 mmol/L (ref 3.5–5.1)
Sodium: 139 mmol/L (ref 135–145)
Total Bilirubin: 0.6 mg/dL (ref 0.3–1.2)
Total Protein: 7.2 g/dL (ref 6.5–8.1)

## 2021-10-02 NOTE — Progress Notes (Signed)
Hematology and Oncology Follow Up Visit  Troy Walter [id number] [birth year] 76 y.o. 10/02/2021 3:04 PM Koirala, Dibas, MDKoirala, Dibas, MD   Principle Diagnosis: 76 year old with castration-resistant advanced prostate cancer with lymphadenopathy diagnosed in 2019.  He was found to have a PSA of 13.5, Gleason score 9.   Prior Therapy: He is status post androgen deprivation therapy and external beam radiation completed in 2020.  His PSA was down to 0.4 in January 2022 with testosterone is at a castrate level.    PSA started to rise in July 2022 was up to 1.7.  In January 2023 was 11.2 and it was confirmed on April 04, 2021 was 11.1.  His testosterone level was 32 in March 2023.  Based on these findings he underwent PSMA PET scan which showed metastatic uptake and avid adenopathy in the periaortic, retroperitoneal and possible mediastinum and supraclavicular nodal areas   Current therapy:  Xtandi 160 mg daily started in April 2023.  Eligard 45 mg every 6 months under the care of Dr. Jeffie Pollock.  This will be given next of his testosterone level increases.  Interim History: [name] is here for a follow-up evaluation.  Since last visit, he reports no major changes in his health.  He has tolerated Xtandi at the current dose without any issues.  He has reported some minor edema as well as gum bleeding periodically.  He denies any recent hospitalizations or illnesses.  He denies any bone pain or pathological fractures.   Medications: Updated on review. Current Outpatient Medications  Medication Sig Dispense Refill   Calcium Carbonate (CALCIUM 500 PO) 1 tablet '1200mg'$      enzalutamide (XTANDI) 40 MG tablet Take 4 tablets (160 mg total) by mouth daily. 120 tablet 3   ibuprofen (ADVIL) 800 MG tablet Take 800 mg by mouth every 8 (eight) hours as needed for moderate pain.     POTASSIUM CHLORIDE ER PO 1 tablet     tamsulosin (FLOMAX) 0.4 MG CAPS capsule TAKE 1 CAPSULE BY MOUTH IN  THE EVENING 100  capsule 2   No current facility-administered medications for this visit.     Allergies: No Known Allergies    Physical Exam: Blood pressure 132/75, pulse (!) 58, temperature 97.8 F (36.6 C), temperature source Temporal, resp. rate 17, height '5\' 10"'$  (1.778 m), weight 221 lb 4.8 oz (100.4 kg), SpO2 97 %. ECOG: 1    General appearance: Alert, awake without any distress. Head: Atraumatic without abnormalities Oropharynx: Without any thrush or ulcers. Eyes: No scleral icterus. Lymph nodes: No lymphadenopathy noted in the cervical, supraclavicular, or axillary nodes Heart:regular rate and rhythm, without any murmurs or gallops.   Lung: Clear to auscultation without any rhonchi, wheezes or dullness to percussion. Abdomin: Soft, nontender without any shifting dullness or ascites. Musculoskeletal: No clubbing or cyanosis. Neurological: No motor or sensory deficits. Skin: No rashes or lesions.      Lab Results: Lab Results  Component Value Date   WBC 4.4 10/02/2021   HGB 12.1 (L) 10/02/2021   HCT 34.5 (L) 10/02/2021   MCV 92.5 10/02/2021   PLT 199 10/02/2021   PSA <0.1 02/08/2019     Chemistry      Component Value Date/Time   NA 138 09/19/2021 1113   K 4.5 09/19/2021 1113   CL 101 09/19/2021 1113   CO2 23 09/19/2021 1113   BUN 15 09/19/2021 1113   CREATININE 1.00 09/19/2021 1113   CREATININE 1.12 06/13/2021 1253      Component Value  Date/Time   CALCIUM 9.5 09/19/2021 1113   ALKPHOS 101 09/19/2021 1113   AST 12 09/19/2021 1113   AST 23 06/13/2021 1253   ALT 11 09/19/2021 1113   ALT 14 06/13/2021 1253   BILITOT 0.7 09/19/2021 1113   BILITOT 0.9 06/13/2021 1253       Impression and Plan:   76 year old with:    1.  Advanced prostate cancer with lymphadenopathy diagnosed in March 2023.  He has castration-resistant after initially diagnosed in 2019.  His PSA has responded reasonably well to Xtandi down to 1.9 in May 2023 from 11.1.  His PSA was 2.0 in  August 2023 with testosterone level that is elevated.  Risks and benefits of continuing Xtandi were discussed at this time.  Alternative treatment options including systemic chemotherapy as well as PARP inhibitor were reiterated.  At this time he is agreeable to continue and we will defer any additional treatment he has progression of disease.   2.  Androgen deprivation: He is currently receiving that under the care of Dr. Jeffie Pollock.  His last testosterone was up to 95 and he will receive another injection in the near future.  This could explain the mild increase in his recent PSA.  3.  Bone health: I recommended continuing calcium and vitamin D supplements.   4.  Follow-up: In 3 months for repeat evaluation.   30  minutes were dedicated to this visit.  The time was spent on reviewing laboratory data, disease status update and outlining future plan of care discussion.  Zola Button, MD 9/6/20233:04 PM

## 2021-10-02 NOTE — Telephone Encounter (Signed)
I was able to get in touch with patient and he does want to proceed with the Lupron '45mg'$  q89month.  Your next available is not until 11/02, can he be a nurse visit before then or ok to schedule your next available?  Please advise

## 2021-10-02 NOTE — Telephone Encounter (Signed)
Nurse visit scheduled, unable to reach patient to inform him of upcoming apt.  Letter mailed informing patient.

## 2021-10-02 NOTE — Telephone Encounter (Signed)
[id number]  Authorization Start Date 10-02-2021 Authorization End Date 10-03-2022

## 2021-10-02 NOTE — Telephone Encounter (Signed)
Can you try and reach back out to  him?

## 2021-10-02 NOTE — Telephone Encounter (Signed)
Patient agreed to start back on Lupron 55m apt scheduled for 2 weeks.  Patient informed of upcoming apt.

## 2021-10-03 ENCOUNTER — Telehealth: Payer: Self-pay | Admitting: *Deleted

## 2021-10-03 LAB — PROSTATE-SPECIFIC AG, SERUM (LABCORP): Prostate Specific Ag, Serum: 1.8 ng/mL (ref 0.0–4.0)

## 2021-10-03 NOTE — Telephone Encounter (Signed)
Attempted to call with results. VM full

## 2021-10-03 NOTE — Telephone Encounter (Signed)
-----   Message from Wyatt Portela, MD sent at 10/03/2021  8:35 AM EDT ----- Please let him know his PSA is down

## 2021-10-16 ENCOUNTER — Ambulatory Visit (INDEPENDENT_AMBULATORY_CARE_PROVIDER_SITE_OTHER): Payer: Medicare Other | Admitting: Physician Assistant

## 2021-10-16 DIAGNOSIS — C61 Malignant neoplasm of prostate: Secondary | ICD-10-CM

## 2021-10-16 MED ORDER — LEUPROLIDE ACETATE (6 MONTH) 45 MG ~~LOC~~ KIT
45.0000 mg | PACK | Freq: Once | SUBCUTANEOUS | Status: AC
  Administered 2021-10-16: 45 mg via SUBCUTANEOUS

## 2021-10-16 NOTE — Progress Notes (Signed)
Eligard SubQ Injection   Due to Prostate Cancer patient is present today for a Eligard Injection.  Medication: Eligard 6 month Dose: 45 mg  Location: left  Lot: [id number]  Exp: [id number]  Patient tolerated well, no complications were noted  Performed by: Estill Bamberg RN       Procedure reviewed. Agree with clinical documentation.  Julienne A Summerlin PA-C

## 2021-10-21 ENCOUNTER — Other Ambulatory Visit: Payer: Self-pay

## 2021-10-21 ENCOUNTER — Inpatient Hospital Stay: Payer: Medicare Other

## 2021-10-21 ENCOUNTER — Encounter: Payer: Self-pay | Admitting: Licensed Clinical Social Worker

## 2021-10-21 ENCOUNTER — Other Ambulatory Visit: Payer: Self-pay | Admitting: Licensed Clinical Social Worker

## 2021-10-21 ENCOUNTER — Inpatient Hospital Stay (HOSPITAL_BASED_OUTPATIENT_CLINIC_OR_DEPARTMENT_OTHER): Payer: Medicare Other | Admitting: Licensed Clinical Social Worker

## 2021-10-21 DIAGNOSIS — C61 Malignant neoplasm of prostate: Secondary | ICD-10-CM | POA: Diagnosis not present

## 2021-10-21 DIAGNOSIS — Z803 Family history of malignant neoplasm of breast: Secondary | ICD-10-CM

## 2021-10-21 DIAGNOSIS — Z807 Family history of other malignant neoplasms of lymphoid, hematopoietic and related tissues: Secondary | ICD-10-CM

## 2021-10-21 DIAGNOSIS — Z800 Family history of malignant neoplasm of digestive organs: Secondary | ICD-10-CM | POA: Diagnosis not present
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
## 2021-10-23 ENCOUNTER — Other Ambulatory Visit (HOSPITAL_COMMUNITY): Payer: Self-pay

## 2021-10-23 ENCOUNTER — Other Ambulatory Visit: Payer: Self-pay | Admitting: Oncology

## 2021-10-23 DIAGNOSIS — C61 Malignant neoplasm of prostate: Secondary | ICD-10-CM

## 2021-10-23 MED ORDER — ENZALUTAMIDE 40 MG PO TABS
160.0000 mg | ORAL_TABLET | Freq: Every day | ORAL | 3 refills | Status: DC
  Filled 2021-10-23: qty 120, 30d supply, fill #0
  Filled 2021-11-20: qty 120, 30d supply, fill #1
  Filled 2021-12-18: qty 120, 30d supply, fill #2
  Filled 2022-01-21: qty 120, 30d supply, fill #3

## 2021-10-30 ENCOUNTER — Other Ambulatory Visit (HOSPITAL_COMMUNITY): Payer: Self-pay

## 2021-11-14 ENCOUNTER — Other Ambulatory Visit (HOSPITAL_COMMUNITY): Payer: Self-pay

## 2021-11-20 ENCOUNTER — Other Ambulatory Visit (HOSPITAL_COMMUNITY): Payer: Self-pay

## 2021-11-25 ENCOUNTER — Other Ambulatory Visit (HOSPITAL_COMMUNITY): Payer: Self-pay

## 2021-12-13 ENCOUNTER — Other Ambulatory Visit (HOSPITAL_COMMUNITY): Payer: Self-pay

## 2021-12-16 ENCOUNTER — Other Ambulatory Visit (HOSPITAL_COMMUNITY): Payer: Self-pay

## 2021-12-17 ENCOUNTER — Other Ambulatory Visit: Payer: Medicare Other

## 2021-12-17 ENCOUNTER — Telehealth: Payer: Self-pay | Admitting: Oncology

## 2021-12-17 NOTE — Telephone Encounter (Signed)
Called patient regarding upcoming December appointment, patient is notified. 

## 2021-12-18 ENCOUNTER — Other Ambulatory Visit (HOSPITAL_COMMUNITY): Payer: Self-pay

## 2021-12-26 ENCOUNTER — Ambulatory Visit: Payer: Medicare Other | Admitting: Urology

## 2021-12-30 ENCOUNTER — Other Ambulatory Visit (HOSPITAL_COMMUNITY): Payer: Self-pay

## 2022-01-01 ENCOUNTER — Inpatient Hospital Stay (HOSPITAL_BASED_OUTPATIENT_CLINIC_OR_DEPARTMENT_OTHER): Payer: Medicare Other | Admitting: Oncology

## 2022-01-01 ENCOUNTER — Other Ambulatory Visit: Payer: Self-pay

## 2022-01-01 ENCOUNTER — Inpatient Hospital Stay: Payer: Medicare Other | Attending: Oncology

## 2022-01-01 VITALS — BP 140/76 | HR 79 | Temp 97.6°F | Resp 18 | Ht 70.0 in | Wt 226.0 lb

## 2022-01-01 DIAGNOSIS — C61 Malignant neoplasm of prostate: Secondary | ICD-10-CM | POA: Insufficient documentation

## 2022-01-01 DIAGNOSIS — Z923 Personal history of irradiation: Secondary | ICD-10-CM | POA: Insufficient documentation

## 2022-01-01 LAB — CBC WITH DIFFERENTIAL (CANCER CENTER ONLY)
Abs Immature Granulocytes: 0.03 10*3/uL (ref 0.00–0.07)
Basophils Absolute: 0 10*3/uL (ref 0.0–0.1)
Basophils Relative: 1 %
Eosinophils Absolute: 0.5 10*3/uL (ref 0.0–0.5)
Eosinophils Relative: 10 %
HCT: 36.1 % — ABNORMAL LOW (ref 39.0–52.0)
Hemoglobin: 12.6 g/dL — ABNORMAL LOW (ref 13.0–17.0)
Immature Granulocytes: 1 %
Lymphocytes Relative: 33 %
Lymphs Abs: 1.5 10*3/uL (ref 0.7–4.0)
MCH: 33.3 pg (ref 26.0–34.0)
MCHC: 34.9 g/dL (ref 30.0–36.0)
MCV: 95.5 fL (ref 80.0–100.0)
Monocytes Absolute: 0.4 10*3/uL (ref 0.1–1.0)
Monocytes Relative: 8 %
Neutro Abs: 2.2 10*3/uL (ref 1.7–7.7)
Neutrophils Relative %: 47 %
Platelet Count: 200 10*3/uL (ref 150–400)
RBC: 3.78 MIL/uL — ABNORMAL LOW (ref 4.22–5.81)
RDW: 13 % (ref 11.5–15.5)
WBC Count: 4.7 10*3/uL (ref 4.0–10.5)
nRBC: 0 % (ref 0.0–0.2)

## 2022-01-01 LAB — CMP (CANCER CENTER ONLY)
ALT: 14 U/L (ref 0–44)
AST: 18 U/L (ref 15–41)
Albumin: 4 g/dL (ref 3.5–5.0)
Alkaline Phosphatase: 83 U/L (ref 38–126)
Anion gap: 5 (ref 5–15)
BUN: 15 mg/dL (ref 8–23)
CO2: 29 mmol/L (ref 22–32)
Calcium: 10 mg/dL (ref 8.9–10.3)
Chloride: 105 mmol/L (ref 98–111)
Creatinine: 1.04 mg/dL (ref 0.61–1.24)
GFR, Estimated: >60 mL/min (ref >60)
Glucose, Bld: 99 mg/dL (ref 70–99)
Potassium: 4.2 mmol/L (ref 3.5–5.1)
Sodium: 139 mmol/L (ref 135–145)
Total Bilirubin: 0.8 mg/dL (ref 0.3–1.2)
Total Protein: 7.2 g/dL (ref 6.5–8.1)

## 2022-01-01 NOTE — Progress Notes (Signed)
Hematology and Oncology Follow Up Visit  Troy Walter [id number] 02-Jun-1945 76 y.o. 01/01/2022 11:26 AM Koirala, Dibas, MDKoirala, Dibas, MD   Principle Diagnosis: 76 year old with advanced prostate cancer and lymphadenopathy diagnosed in 2019 after presenting with Gleason score 9 and a PSA of 13. He subsequently developed castration-resistant disease in 2023.     Prior Therapy: He is status post androgen deprivation therapy and external beam radiation completed in 2020.  His PSA was down to 0.4 in January 2022 with testosterone is at a castrate level.    PSA started to rise in July 2022 was up to 1.7.  In January 2023 was 11.2 and it was confirmed on April 04, 2021 was 11.1.  His testosterone level was 32 in March 2023.  Based on these findings he underwent PSMA PET scan which showed metastatic uptake and avid adenopathy in the periaortic, retroperitoneal and possible mediastinum and supraclavicular nodal areas   Current therapy:  Xtandi 160 mg daily started in April 2023.  Eligard 45 mg every 6 months under the care of Dr. Jeffie Pollock.  This will be given next of his testosterone level increases.  Interim History: Troy Walter returns today for a follow-up visit.  Since last visit, he reports no major changes in his health.  He continues to tolerate Xtandi without any recent complaints.  He denies excessive fatigue, tiredness or weakness.  Stable to drive and attends to activities of daily living.  Medications: Reviewed without changes. Current Outpatient Medications  Medication Sig Dispense Refill   Calcium Carbonate (CALCIUM 500 PO) 1 tablet '1200mg'$      enzalutamide (XTANDI) 40 MG tablet Take 4 tablets (160 mg total) by mouth daily. 120 tablet 3   ibuprofen (ADVIL) 800 MG tablet Take 800 mg by mouth every 8 (eight) hours as needed for moderate pain.     POTASSIUM CHLORIDE ER PO 1 tablet     tamsulosin (FLOMAX) 0.4 MG CAPS capsule TAKE 1 CAPSULE BY MOUTH IN  THE EVENING 100 capsule 2   No  current facility-administered medications for this visit.     Allergies: No Known Allergies    Physical Exam: Blood pressure (!) 140/76, pulse 79, temperature 97.6 F (36.4 C), temperature source Temporal, resp. rate 18, height '5\' 10"'$  (1.778 m), weight 226 lb (102.5 kg), SpO2 98 %.  ECOG: 1   General appearance: Comfortable appearing without any discomfort Head: Normocephalic without any trauma Oropharynx: Mucous membranes are moist and pink without any thrush or ulcers. Eyes: Pupils are equal and round reactive to light. Lymph nodes: No cervical, supraclavicular, inguinal or axillary lymphadenopathy.   Heart:regular rate and rhythm.  S1 and S2 without leg edema. Lung: Clear without any rhonchi or wheezes.  No dullness to percussion. Abdomin: Soft, nontender, nondistended with good bowel sounds.  No hepatosplenomegaly. Musculoskeletal: No joint deformity or effusion.  Full range of motion noted. Neurological: No deficits noted on motor, sensory and deep tendon reflex exam. Skin: No petechial rash or dryness.  Appeared moist.       Lab Results: Lab Results  Component Value Date   WBC 4.4 10/02/2021   HGB 12.1 (L) 10/02/2021   HCT 34.5 (L) 10/02/2021   MCV 92.5 10/02/2021   PLT 199 10/02/2021   PSA <0.1 02/08/2019     Chemistry      Component Value Date/Time   NA 139 10/02/2021 1450   NA 138 09/19/2021 1113   K 4.0 10/02/2021 1450   CL 109 10/02/2021 1450   CO2 25  10/02/2021 1450   BUN 19 10/02/2021 1450   BUN 15 09/19/2021 1113   CREATININE 1.04 10/02/2021 1450      Component Value Date/Time   CALCIUM 9.5 10/02/2021 1450   ALKPHOS 74 10/02/2021 1450   AST 18 10/02/2021 1450   ALT 14 10/02/2021 1450   BILITOT 0.6 10/02/2021 1450      Latest Reference Range & Units 04/04/21 13:54 06/13/21 12:53 09/19/21 11:13 10/02/21 14:50  Prostate Specific Ag, Serum 0.0 - 4.0 ng/mL 11.1 (H) 1.9 2.0 1.8  (H): Data is abnormally high  Impression and  Plan:   76 year old with:    1.  Castration-resistant advanced prostate cancer with lymphadenopathy diagnosed in March 2023.    He is currently on Xtandi with excellent PSA response at this time without any major complications.  Risks and benefits of continuing this treatment were discussed at this time.  These issues including hypertension, fatigue and rarely seizures.  Alternative treatment options including systemic chemotherapy, PARP inhibitor if he harbors the appropriate mutation among others.  For the time being is agreeable to continue.    2.  Androgen deprivation: He is following with Dr. Jeffie Pollock regarding this issue.  He will receive Eligard if his testosterone is up.  Last treatment was given on September 20 for a total of 45 mg of Eligard.  3.  Bone health: He is to continue calcium and vitamin D supplements.  Delton See will be used if he develops bone disease.  4.  Follow-up: He will return in 3 months for repeat evaluation.  We will arrange follow-up at [facility] for convenience for him moving forward.   30  minutes were spent on this encounter.  The time was dedicated to updating disease status, treatment choices and outlining future plan of care review.  Zola Button, MD 12/6/202311:26 AM

## 2022-01-02 ENCOUNTER — Other Ambulatory Visit (HOSPITAL_COMMUNITY): Payer: Self-pay

## 2022-01-02 ENCOUNTER — Telehealth: Payer: Self-pay | Admitting: *Deleted

## 2022-01-02 LAB — PROSTATE-SPECIFIC AG, SERUM (LABCORP): Prostate Specific Ag, Serum: 1.8 ng/mL (ref 0.0–4.0)

## 2022-01-02 NOTE — Telephone Encounter (Signed)
PC to patient, informed him of PSA results, he verbalizes understanding. 

## 2022-01-02 NOTE — Telephone Encounter (Signed)
-----   Message from Troy Portela, MD sent at 01/02/2022  9:15 AM EST ----- Please let him know his PSA is still low

## 2022-01-08 ENCOUNTER — Telehealth: Payer: Self-pay | Admitting: Pharmacy Technician

## 2022-01-08 NOTE — Telephone Encounter (Signed)
Oral Oncology Patient Advocate Encounter   Received notification that prior authorization for [name] is due for renewal.   PA submitted on 01/08/22 Key [id number] Status is pending     Lady Deutscher, [place] Patient [place] Direct Number: [phone]  Fax: [phone]

## 2022-01-08 NOTE — Telephone Encounter (Signed)
Oral Oncology Patient Advocate Encounter  Prior Authorization for Troy Walter has been approved.    PA# [id number] Effective dates: 01/08/22 through 01/27/23  Patient may continue to fill as normal.    Lady Deutscher, CPhT-Adv Oncology Pharmacy Patient [place] Direct Number: [phone]  Fax: [phone]

## 2022-01-21 ENCOUNTER — Other Ambulatory Visit (HOSPITAL_COMMUNITY): Payer: Self-pay

## 2022-01-23 ENCOUNTER — Ambulatory Visit: Payer: Medicare Other | Admitting: Urology

## 2022-01-31 ENCOUNTER — Other Ambulatory Visit: Payer: Self-pay

## 2022-02-19 ENCOUNTER — Other Ambulatory Visit (HOSPITAL_COMMUNITY): Payer: Self-pay

## 2022-02-24 ENCOUNTER — Other Ambulatory Visit (HOSPITAL_COMMUNITY): Payer: Self-pay

## 2022-03-05 ENCOUNTER — Other Ambulatory Visit: Payer: Self-pay | Admitting: Hematology

## 2022-03-05 ENCOUNTER — Other Ambulatory Visit (HOSPITAL_COMMUNITY): Payer: Self-pay

## 2022-03-05 DIAGNOSIS — C61 Malignant neoplasm of prostate: Secondary | ICD-10-CM

## 2022-03-05 MED ORDER — ENZALUTAMIDE 40 MG PO TABS
160.0000 mg | ORAL_TABLET | Freq: Every day | ORAL | 3 refills | Status: DC
  Filled 2022-03-05: qty 120, 30d supply, fill #0
  Filled 2022-03-28: qty 120, 30d supply, fill #1
  Filled 2022-04-24: qty 120, 30d supply, fill #2

## 2022-03-06 ENCOUNTER — Other Ambulatory Visit (HOSPITAL_COMMUNITY): Payer: Self-pay

## 2022-03-06 ENCOUNTER — Other Ambulatory Visit: Payer: Self-pay

## 2022-03-20 ENCOUNTER — Other Ambulatory Visit (HOSPITAL_COMMUNITY): Payer: Self-pay

## 2022-03-24 ENCOUNTER — Other Ambulatory Visit: Payer: Self-pay

## 2022-03-28 ENCOUNTER — Other Ambulatory Visit: Payer: Self-pay

## 2022-03-28 ENCOUNTER — Other Ambulatory Visit (HOSPITAL_COMMUNITY): Payer: Self-pay

## 2022-03-28 DIAGNOSIS — C61 Malignant neoplasm of prostate: Secondary | ICD-10-CM

## 2022-03-31 ENCOUNTER — Inpatient Hospital Stay: Payer: 59 | Attending: Oncology

## 2022-03-31 ENCOUNTER — Other Ambulatory Visit: Payer: Self-pay

## 2022-03-31 DIAGNOSIS — D649 Anemia, unspecified: Secondary | ICD-10-CM | POA: Insufficient documentation

## 2022-03-31 DIAGNOSIS — Z8042 Family history of malignant neoplasm of prostate: Secondary | ICD-10-CM | POA: Diagnosis not present

## 2022-03-31 DIAGNOSIS — Z803 Family history of malignant neoplasm of breast: Secondary | ICD-10-CM | POA: Diagnosis not present

## 2022-03-31 DIAGNOSIS — Z79899 Other long term (current) drug therapy: Secondary | ICD-10-CM | POA: Diagnosis not present

## 2022-03-31 DIAGNOSIS — R350 Frequency of micturition: Secondary | ICD-10-CM | POA: Diagnosis not present

## 2022-03-31 DIAGNOSIS — R599 Enlarged lymph nodes, unspecified: Secondary | ICD-10-CM | POA: Diagnosis not present

## 2022-03-31 DIAGNOSIS — Z807 Family history of other malignant neoplasms of lymphoid, hematopoietic and related tissues: Secondary | ICD-10-CM | POA: Insufficient documentation

## 2022-03-31 DIAGNOSIS — Z923 Personal history of irradiation: Secondary | ICD-10-CM | POA: Insufficient documentation

## 2022-03-31 DIAGNOSIS — C779 Secondary and unspecified malignant neoplasm of lymph node, unspecified: Secondary | ICD-10-CM | POA: Diagnosis not present

## 2022-03-31 DIAGNOSIS — Z809 Family history of malignant neoplasm, unspecified: Secondary | ICD-10-CM | POA: Diagnosis not present

## 2022-03-31 DIAGNOSIS — C61 Malignant neoplasm of prostate: Secondary | ICD-10-CM | POA: Insufficient documentation

## 2022-03-31 DIAGNOSIS — Z808 Family history of malignant neoplasm of other organs or systems: Secondary | ICD-10-CM | POA: Diagnosis not present

## 2022-03-31 LAB — CBC WITH DIFFERENTIAL/PLATELET
Abs Immature Granulocytes: 0.01 10*3/uL (ref 0.00–0.07)
Basophils Absolute: 0 10*3/uL (ref 0.0–0.1)
Basophils Relative: 1 %
Eosinophils Absolute: 0.6 10*3/uL — ABNORMAL HIGH (ref 0.0–0.5)
Eosinophils Relative: 13 %
HCT: 35.3 % — ABNORMAL LOW (ref 39.0–52.0)
Hemoglobin: 12.1 g/dL — ABNORMAL LOW (ref 13.0–17.0)
Immature Granulocytes: 0 %
Lymphocytes Relative: 36 %
Lymphs Abs: 1.5 10*3/uL (ref 0.7–4.0)
MCH: 32.8 pg (ref 26.0–34.0)
MCHC: 34.3 g/dL (ref 30.0–36.0)
MCV: 95.7 fL (ref 80.0–100.0)
Monocytes Absolute: 0.4 10*3/uL (ref 0.1–1.0)
Monocytes Relative: 9 %
Neutro Abs: 1.8 10*3/uL (ref 1.7–7.7)
Neutrophils Relative %: 41 %
Platelets: 176 10*3/uL (ref 150–400)
RBC: 3.69 MIL/uL — ABNORMAL LOW (ref 4.22–5.81)
RDW: 13 % (ref 11.5–15.5)
WBC: 4.3 10*3/uL (ref 4.0–10.5)
nRBC: 0 % (ref 0.0–0.2)

## 2022-03-31 LAB — COMPREHENSIVE METABOLIC PANEL
ALT: 16 U/L (ref 0–44)
AST: 19 U/L (ref 15–41)
Albumin: 3.6 g/dL (ref 3.5–5.0)
Alkaline Phosphatase: 74 U/L (ref 38–126)
Anion gap: 7 (ref 5–15)
BUN: 16 mg/dL (ref 8–23)
CO2: 26 mmol/L (ref 22–32)
Calcium: 9 mg/dL (ref 8.9–10.3)
Chloride: 103 mmol/L (ref 98–111)
Creatinine, Ser: 1.12 mg/dL (ref 0.61–1.24)
GFR, Estimated: >60 mL/min (ref >60)
Glucose, Bld: 96 mg/dL (ref 70–99)
Potassium: 3.9 mmol/L (ref 3.5–5.1)
Sodium: 136 mmol/L (ref 135–145)
Total Bilirubin: 1 mg/dL (ref 0.3–1.2)
Total Protein: 7.2 g/dL (ref 6.5–8.1)

## 2022-03-31 LAB — PSA: Prostatic Specific Antigen: 2.28 ng/mL (ref 0.00–4.00)

## 2022-04-01 ENCOUNTER — Other Ambulatory Visit: Payer: 59

## 2022-04-07 ENCOUNTER — Inpatient Hospital Stay (HOSPITAL_BASED_OUTPATIENT_CLINIC_OR_DEPARTMENT_OTHER): Payer: 59 | Admitting: Hematology

## 2022-04-07 VITALS — BP 116/80 | HR 68 | Temp 98.3°F | Resp 18 | Ht 70.0 in | Wt 223.6 lb

## 2022-04-07 DIAGNOSIS — Z79899 Other long term (current) drug therapy: Secondary | ICD-10-CM | POA: Diagnosis not present

## 2022-04-07 DIAGNOSIS — D649 Anemia, unspecified: Secondary | ICD-10-CM | POA: Diagnosis not present

## 2022-04-07 DIAGNOSIS — Z923 Personal history of irradiation: Secondary | ICD-10-CM

## 2022-04-07 DIAGNOSIS — C61 Malignant neoplasm of prostate: Secondary | ICD-10-CM

## 2022-04-07 DIAGNOSIS — Z807 Family history of other malignant neoplasms of lymphoid, hematopoietic and related tissues: Secondary | ICD-10-CM | POA: Diagnosis not present

## 2022-04-07 DIAGNOSIS — Z8042 Family history of malignant neoplasm of prostate: Secondary | ICD-10-CM

## 2022-04-07 DIAGNOSIS — Z809 Family history of malignant neoplasm, unspecified: Secondary | ICD-10-CM | POA: Diagnosis not present

## 2022-04-07 DIAGNOSIS — C779 Secondary and unspecified malignant neoplasm of lymph node, unspecified: Secondary | ICD-10-CM | POA: Diagnosis not present

## 2022-04-07 DIAGNOSIS — Z803 Family history of malignant neoplasm of breast: Secondary | ICD-10-CM | POA: Diagnosis not present

## 2022-04-07 DIAGNOSIS — R590 Localized enlarged lymph nodes: Secondary | ICD-10-CM

## 2022-04-07 DIAGNOSIS — Z808 Family history of malignant neoplasm of other organs or systems: Secondary | ICD-10-CM | POA: Diagnosis not present

## 2022-04-07 DIAGNOSIS — R350 Frequency of micturition: Secondary | ICD-10-CM | POA: Diagnosis not present

## 2022-04-07 DIAGNOSIS — R599 Enlarged lymph nodes, unspecified: Secondary | ICD-10-CM | POA: Diagnosis not present

## 2022-04-07 NOTE — Progress Notes (Signed)
Patient is taking Xtandi as prescribed. He reports that he may have missed one dose in the last two weeks and reports no side effects at this time.
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
## 2022-04-07 NOTE — Patient Instructions (Signed)
[facility]  Discharge Instructions  You were seen and examined today by Dr. Delton Coombes. Dr. Delton Coombes is a medical oncologist, meaning that he specializes in the treatment of cancer diagnoses. Dr. Delton Coombes discussed your past medical history, family history of cancers, and the events that led to you being here today.  You were referred to Dr. Delton Coombes for ongoing management of your prostate cancer.  Continue Xtandi as prescribed.  Continue Lupron injections with Dr. Delton Coombes.  Dr. Delton Coombes has referred you for genetic testing due to your personal and family history of cancer.  Dr. Delton Coombes will arrange for labs and a bone density scan prior to your next visit with Dr. Delton Coombes.  Follow-up as scheduled.  Thank you for choosing [facility] to provide your oncology and hematology care.   To afford each patient quality time with our provider, please arrive at least 15 minutes before your scheduled appointment time. You may need to reschedule your appointment if you arrive late (10 or more minutes). Arriving late affects you and other patients whose appointments are after yours.  Also, if you miss three or more appointments without notifying the office, you may be dismissed from the clinic at the provider's discretion.    Again, thank you for choosing [facility].  Our hope is that these requests will decrease the amount of time that you wait before being seen by our physicians.   If you have a lab appointment with the [facility] please come in thru the Main Entrance and check in at the main information desk.           _____________________________________________________________  Should you have questions after your visit to [facility], please contact our office at [phone] and follow the prompts.  Our office hours are 8:00 a.m. to 4:30 p.m. Monday - Thursday and 8:00 a.m. to 2:30 p.m.  Friday.  Please note that voicemails left after 4:00 p.m. may not be returned until the following business day.  We are closed weekends and all major holidays.  You do have access to a nurse 24-7, just call the main number to the clinic [phone] and do not press any options, hold on the line and a nurse will answer the phone.    For prescription refill requests, have your pharmacy contact our office and allow 72 hours.    Masks are optional in the cancer centers. If you would like for your care team to wear a mask while they are taking care of you, please let them know. You may have one support person who is at least 79 years old accompany you for your appointments.

## 2022-04-17 ENCOUNTER — Ambulatory Visit: Payer: 59 | Admitting: Urology

## 2022-04-21 DIAGNOSIS — E7800 Pure hypercholesterolemia, unspecified: Secondary | ICD-10-CM | POA: Diagnosis not present

## 2022-04-21 DIAGNOSIS — Z79899 Other long term (current) drug therapy: Secondary | ICD-10-CM | POA: Diagnosis not present

## 2022-04-21 DIAGNOSIS — C771 Secondary and unspecified malignant neoplasm of intrathoracic lymph nodes: Secondary | ICD-10-CM | POA: Diagnosis not present

## 2022-04-21 DIAGNOSIS — R062 Wheezing: Secondary | ICD-10-CM | POA: Diagnosis not present

## 2022-04-21 DIAGNOSIS — Z0001 Encounter for general adult medical examination with abnormal findings: Secondary | ICD-10-CM | POA: Diagnosis not present

## 2022-04-21 DIAGNOSIS — R7301 Impaired fasting glucose: Secondary | ICD-10-CM | POA: Diagnosis not present

## 2022-04-21 DIAGNOSIS — D649 Anemia, unspecified: Secondary | ICD-10-CM | POA: Diagnosis not present

## 2022-04-21 DIAGNOSIS — D8481 Immunodeficiency due to conditions classified elsewhere: Secondary | ICD-10-CM | POA: Diagnosis not present

## 2022-04-22 ENCOUNTER — Other Ambulatory Visit: Payer: Self-pay | Admitting: Urology

## 2022-04-22 ENCOUNTER — Other Ambulatory Visit: Payer: Self-pay | Admitting: Family Medicine

## 2022-04-22 ENCOUNTER — Ambulatory Visit: Payer: Self-pay | Admitting: Licensed Clinical Social Worker

## 2022-04-22 ENCOUNTER — Encounter: Payer: Self-pay | Admitting: Licensed Clinical Social Worker

## 2022-04-22 ENCOUNTER — Telehealth: Payer: Self-pay | Admitting: Licensed Clinical Social Worker

## 2022-04-22 DIAGNOSIS — C771 Secondary and unspecified malignant neoplasm of intrathoracic lymph nodes: Secondary | ICD-10-CM

## 2022-04-22 DIAGNOSIS — R062 Wheezing: Secondary | ICD-10-CM

## 2022-04-22 DIAGNOSIS — Z1379 Encounter for other screening for genetic and chromosomal anomalies: Secondary | ICD-10-CM | POA: Insufficient documentation

## 2022-04-22 DIAGNOSIS — C61 Malignant neoplasm of prostate: Secondary | ICD-10-CM

## 2022-04-22 DIAGNOSIS — N403 Nodular prostate with lower urinary tract symptoms: Secondary | ICD-10-CM

## 2022-04-22 NOTE — Progress Notes (Signed)
HPI:   [name] was previously seen in the [facility] due to a personal and family history of cancer and concerns regarding a hereditary predisposition to cancer in 2023, referred by Dr. Alen Blew. [name] declined testing at that time. In March 2024, Dr. Delton Coombes requested genetic testing to be ordered.  Please refer to our prior cancer genetics clinic note for more information regarding our discussion, assessment and recommendations, at the time. Results and recommendations are discussed in more detail below.  CANCER HISTORY:  Oncology History  Prostate cancer ([place])  12/03/2017 Initial Diagnosis   Prostate cancer ([place])   04/24/2021 Cancer Staging   Staging form: Prostate, AJCC 8th Edition - Clinical: Stage IVB (cT3, cN1, pM1, PSA: 13.5, Grade Group: 5) - Signed by Wyatt Portela, MD on 04/24/2021 Prostate specific antigen (PSA) range: 10 to 19 Histologic grading system: 5 grade system    Genetic Testing   Negative genetic testing. No pathogenic variants identified on the [facility] Common Hereditary Cancers+RNA panel. The report date is 04/22/2022.  The Common Hereditary Cancers Panel + RNA offered by [facility] includes sequencing and/or deletion duplication testing of the following 48 genes: APC*, ATM*, AXIN2, BAP1, BARD1, BMPR1A, BRCA1, BRCA2, BRIP1, CDH1, CDK4, CDKN2A (p14ARF), CDKN2A (p16INK4a), CHEK2, CTNNA1, DICER1*, EPCAM*, FH*, GREM1*, HOXB13, KIT, MBD4, MEN1*, MLH1*, MSH2*, MSH3*, MSH6*, MUTYH, NF1*, NTHL1, PALB2, PDGFRA, PMS2*, POLD1*, POLE, PTEN*, RAD51C, RAD51D, SDHA*, SDHB, SDHC*, SDHD, SMAD4, SMARCA4, STK11, TP53, TSC1*, TSC2, VHL.      FAMILY HISTORY:  We obtained a detailed, 4-generation family history.  Significant diagnoses are listed below: Family History  Problem Relation Age of Onset   Non-Hodgkin's lymphoma Sister    Cancer Sister 14       other cancer, unsure type   Esophageal cancer Brother 53   Prostate cancer Cousin        dx 26s    Breast cancer Cousin     [name] has 3 daughters. He had 2 sisters and 2 brothers. One brother had esophageal cancer at 65. A sister had Non-Hodgkin's lymphoma and now has another type of cancer, unsure type, at 37.    [name] mother possibly had cancer, unsure type, she had a mass on her spine. She passed at 90. A maternal cousin had prostate cancer. No other known cancers on this side of the family.   [name] father passed at 46. A distant cousin had breast cancer. No other cancers on this side of the family.   [name] is unaware of previous family history of genetic testing for hereditary cancer risks. There is no reported Ashkenazi Jewish ancestry. There is no known consanguinity.   GENETIC TEST RESULTS:  The [facility] Common Hereditary Cancers+RNA Panel found no pathogenic mutations.   The Common Hereditary Cancers Panel + RNA offered by [facility] includes sequencing and/or deletion duplication testing of the following 48 genes: APC*, ATM*, AXIN2, BAP1, BARD1, BMPR1A, BRCA1, BRCA2, BRIP1, CDH1, CDK4, CDKN2A (p14ARF), CDKN2A (p16INK4a), CHEK2, CTNNA1, DICER1*, EPCAM*, FH*, GREM1*, HOXB13, KIT, MBD4, MEN1*, MLH1*, MSH2*, MSH3*, MSH6*, MUTYH, NF1*, NTHL1, PALB2, PDGFRA, PMS2*, POLD1*, POLE, PTEN*, RAD51C, RAD51D, SDHA*, SDHB, SDHC*, SDHD, SMAD4, SMARCA4, STK11, TP53, TSC1*, TSC2, VHL.   The test report has been scanned into EPIC and is located under the Molecular Pathology section of the Results Review tab.  A portion of the result report is included below for reference. Genetic testing reported out on 04/22/2022.     Even though a pathogenic variant was not  identified, possible explanations for the cancer in the family may include: There may be no hereditary risk for cancer in the family. The cancers in [name] and/or his family may be sporadic/familial or due to other genetic and environmental factors. There may be a gene mutation in one of these genes that current testing  methods cannot detect but that chance is small. There could be another gene that has not yet been discovered, or that we have not yet tested, that is responsible for the cancer diagnoses in the family.  It is also possible there is a hereditary cause for the cancer in the family that [name] did not inherit.  Therefore, it is important to remain in touch with cancer genetics in the future so that we can continue to offer [name] the most up to date genetic testing.   ADDITIONAL GENETIC TESTING:  We discussed with [name] that his genetic testing was fairly extensive.  If there are additional relevant genes identified to increase cancer risk that can be analyzed in the future, we would be happy to discuss and coordinate this testing at that time.    CANCER SCREENING RECOMMENDATIONS:  [name] test result is considered negative (normal).  This means that we have not identified a hereditary cause for his personal and family history of cancer at this time.   An individual's cancer risk and medical management are not determined by genetic test results alone. Overall cancer risk assessment incorporates additional factors, including personal medical history, family history, and any available genetic information that may result in a personalized plan for cancer prevention and surveillance. Therefore, it is recommended he continue to follow the cancer management and screening guidelines provided by his oncology and primary healthcare provider.  RECOMMENDATIONS FOR FAMILY MEMBERS:   Since he did not inherit a identifiable mutation in a cancer predisposition gene included on this panel, his children could not have inherited a known mutation from him in one of these genes. Individuals in this family might be at some increased risk of developing cancer, over the general population risk, due to the family history of cancer.  Individuals in the family should notify their providers of the family history  of cancer. We recommend women in this family have a yearly mammogram beginning at age 100, or 40 years younger than the earliest onset of cancer, an annual clinical breast exam, and perform monthly breast self-exams.  Family members should have colonoscopies by at age 28, or earlier, as recommended by their providers.   FOLLOW-UP:  Lastly, cancer genetics is a rapidly advancing field and it is possible that new genetic tests will be appropriate for him and/or his family members in the future. We encouraged him to remain in contact with cancer genetics on an annual basis so we can update his personal and family histories and let him know of advances in cancer genetics that may benefit this family.   Our contact number was provided. [name] knows he is welcome to call us at anytime with additional questions or concerns.    [name], MS, [facility] Genetic Counselor [email or web address] Phone: [phone]

## 2022-04-22 NOTE — Telephone Encounter (Signed)
I contacted Troy Walter to discuss his genetic testing results. Voicemail is full and home phone number is no longer in service. Will send him a letter with results and recommendations. No pathogenic variants were identified in the 48 genes analyzed. Detailed clinic note to follow.   The test report has been scanned into EPIC and is located under the Molecular Pathology section of the Results Review tab.  A portion of the result report is included below for reference.      [name], MS, [facility] Genetic Counselor [name] Phone: [phone]

## 2022-04-24 ENCOUNTER — Other Ambulatory Visit (HOSPITAL_COMMUNITY): Payer: Self-pay

## 2022-04-24 ENCOUNTER — Ambulatory Visit (INDEPENDENT_AMBULATORY_CARE_PROVIDER_SITE_OTHER): Payer: 59 | Admitting: Urology

## 2022-04-24 VITALS — BP 128/75 | HR 56 | Ht 70.0 in | Wt 223.6 lb

## 2022-04-24 DIAGNOSIS — N403 Nodular prostate with lower urinary tract symptoms: Secondary | ICD-10-CM

## 2022-04-24 DIAGNOSIS — C61 Malignant neoplasm of prostate: Secondary | ICD-10-CM

## 2022-04-24 DIAGNOSIS — N138 Other obstructive and reflux uropathy: Secondary | ICD-10-CM | POA: Diagnosis not present

## 2022-04-24 LAB — URINALYSIS, ROUTINE W REFLEX MICROSCOPIC
Bilirubin, UA: NEGATIVE
Glucose, UA: NEGATIVE
Ketones, UA: NEGATIVE
Leukocytes,UA: NEGATIVE
Nitrite, UA: NEGATIVE
Protein,UA: NEGATIVE
RBC, UA: NEGATIVE
Specific Gravity, UA: 1.02 (ref 1.005–1.030)
Urobilinogen, Ur: 0.2 mg/dL (ref 0.2–1.0)
pH, UA: 5.5 (ref 5.0–7.5)
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
## 2022-04-25 ENCOUNTER — Other Ambulatory Visit: Payer: Self-pay | Admitting: Hematology

## 2022-04-25 ENCOUNTER — Encounter: Payer: Self-pay | Admitting: Urology

## 2022-04-25 ENCOUNTER — Other Ambulatory Visit (HOSPITAL_COMMUNITY): Payer: Self-pay

## 2022-04-25 MED ORDER — LEUPROLIDE ACETATE (6 MONTH) 45 MG ~~LOC~~ KIT: 45.0000 mg | PACK | Freq: Once | SUBCUTANEOUS | Status: AC

## 2022-04-30 ENCOUNTER — Ambulatory Visit
Admission: RE | Admit: 2022-04-30 | Discharge: 2022-04-30 | Disposition: A | Discharge status: Home / Self Care | Payer: 59 | Source: Ambulatory Visit | Attending: Family Medicine | Admitting: Family Medicine

## 2022-04-30 DIAGNOSIS — I700 Atherosclerosis of aorta: Secondary | ICD-10-CM | POA: Diagnosis not present

## 2022-04-30 DIAGNOSIS — R062 Wheezing: Secondary | ICD-10-CM

## 2022-04-30 DIAGNOSIS — R911 Solitary pulmonary nodule: Secondary | ICD-10-CM | POA: Diagnosis not present

## 2022-04-30 DIAGNOSIS — C771 Secondary and unspecified malignant neoplasm of intrathoracic lymph nodes: Secondary | ICD-10-CM

## 2022-04-30 DIAGNOSIS — C61 Malignant neoplasm of prostate: Secondary | ICD-10-CM

## 2022-05-08 ENCOUNTER — Telehealth: Payer: 59 | Admitting: Licensed Clinical Social Worker

## 2022-05-27 ENCOUNTER — Other Ambulatory Visit (HOSPITAL_COMMUNITY): Payer: Self-pay

## 2022-05-30 ENCOUNTER — Other Ambulatory Visit (HOSPITAL_COMMUNITY): Payer: Self-pay

## 2022-06-02 ENCOUNTER — Inpatient Hospital Stay: Payer: 59 | Attending: Oncology

## 2022-06-02 DIAGNOSIS — C779 Secondary and unspecified malignant neoplasm of lymph node, unspecified: Secondary | ICD-10-CM | POA: Insufficient documentation

## 2022-06-02 DIAGNOSIS — E559 Vitamin D deficiency, unspecified: Secondary | ICD-10-CM | POA: Diagnosis not present

## 2022-06-02 DIAGNOSIS — C61 Malignant neoplasm of prostate: Secondary | ICD-10-CM | POA: Insufficient documentation

## 2022-06-02 LAB — CBC WITH DIFFERENTIAL/PLATELET
Abs Immature Granulocytes: 0.01 10*3/uL (ref 0.00–0.07)
Basophils Absolute: 0 10*3/uL (ref 0.0–0.1)
Basophils Relative: 1 %
Eosinophils Absolute: 0.5 10*3/uL (ref 0.0–0.5)
Eosinophils Relative: 11 %
HCT: 36.3 % — ABNORMAL LOW (ref 39.0–52.0)
Hemoglobin: 12.4 g/dL — ABNORMAL LOW (ref 13.0–17.0)
Immature Granulocytes: 0 %
Lymphocytes Relative: 40 %
Lymphs Abs: 2 10*3/uL (ref 0.7–4.0)
MCH: 32.8 pg (ref 26.0–34.0)
MCHC: 34.2 g/dL (ref 30.0–36.0)
MCV: 96 fL (ref 80.0–100.0)
Monocytes Absolute: 0.4 10*3/uL (ref 0.1–1.0)
Monocytes Relative: 8 %
Neutro Abs: 2.1 10*3/uL (ref 1.7–7.7)
Neutrophils Relative %: 40 %
Platelets: 190 10*3/uL (ref 150–400)
RBC: 3.78 MIL/uL — ABNORMAL LOW (ref 4.22–5.81)
RDW: 13.2 % (ref 11.5–15.5)
WBC: 5.1 10*3/uL (ref 4.0–10.5)
nRBC: 0 % (ref 0.0–0.2)

## 2022-06-02 LAB — VITAMIN D 25 HYDROXY (VIT D DEFICIENCY, FRACTURES): Vit D, 25-Hydroxy: 13.98 ng/mL — ABNORMAL LOW (ref 30–100)

## 2022-06-02 LAB — COMPREHENSIVE METABOLIC PANEL
ALT: 17 U/L (ref 0–44)
AST: 23 U/L (ref 15–41)
Albumin: 3.9 g/dL (ref 3.5–5.0)
Alkaline Phosphatase: 89 U/L (ref 38–126)
Anion gap: 11 (ref 5–15)
BUN: 22 mg/dL (ref 8–23)
CO2: 24 mmol/L (ref 22–32)
Calcium: 9.1 mg/dL (ref 8.9–10.3)
Chloride: 103 mmol/L (ref 98–111)
Creatinine, Ser: 1.12 mg/dL (ref 0.61–1.24)
GFR, Estimated: >60 mL/min (ref >60)
Glucose, Bld: 98 mg/dL (ref 70–99)
Potassium: 3.8 mmol/L (ref 3.5–5.1)
Sodium: 138 mmol/L (ref 135–145)
Total Bilirubin: 0.9 mg/dL (ref 0.3–1.2)
Total Protein: 7.8 g/dL (ref 6.5–8.1)

## 2022-06-02 LAB — PSA: Prostatic Specific Antigen: 2.65 ng/mL (ref 0.00–4.00)

## 2022-06-04 LAB — TESTOSTERONE: Testosterone: <3 ng/dL — ABNORMAL LOW (ref 264–916)

## 2022-06-09 ENCOUNTER — Inpatient Hospital Stay (HOSPITAL_BASED_OUTPATIENT_CLINIC_OR_DEPARTMENT_OTHER): Payer: 59 | Admitting: Hematology

## 2022-06-09 VITALS — BP 126/75 | HR 67 | Temp 97.0°F | Resp 18 | Ht 70.0 in | Wt 226.5 lb

## 2022-06-09 DIAGNOSIS — C61 Malignant neoplasm of prostate: Secondary | ICD-10-CM

## 2022-06-09 DIAGNOSIS — C779 Secondary and unspecified malignant neoplasm of lymph node, unspecified: Secondary | ICD-10-CM | POA: Diagnosis not present

## 2022-06-09 DIAGNOSIS — E559 Vitamin D deficiency, unspecified: Secondary | ICD-10-CM | POA: Diagnosis not present

## 2022-06-09 MED ORDER — ERGOCALCIFEROL 1.25 MG (50000 UT) PO CAPS: 50000.0000 [IU] | ORAL_CAPSULE | ORAL | 3 refills | Status: DC
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
## 2022-06-09 NOTE — Patient Instructions (Addendum)
[facility]  Discharge Instructions  You were seen and examined today by Dr. Ellin Saba.  Dr. Ellin Saba discussed your most recent lab work. Your Vitamin D is low. Dr. Ellin Saba has sent a prescription for that.  Troy Walter is no longer controlling the prostate cancer. Dr. Ellin Saba has recommended a change. You do not qualify for any mutation specific pill treatments. This is most likely because you haven't had your Eligard. Please follow-up with Dr. Belva Crome office to get Eligard.  Follow-up as scheduled.  Thank you for choosing [facility] to provide your oncology and hematology care.   To afford each patient quality time with our provider, please arrive at least 15 minutes before your scheduled appointment time. You may need to reschedule your appointment if you arrive late (10 or more minutes). Arriving late affects you and other patients whose appointments are after yours.  Also, if you miss three or more appointments without notifying the office, you may be dismissed from the clinic at the provider's discretion.    Again, thank you for choosing [facility].  Our hope is that these requests will decrease the amount of time that you wait before being seen by our physicians.   If you have a lab appointment with the Cancer Center - please note that after April 8th, all labs will be drawn in the cancer center.  You do not have to check in or register with the main entrance as you have in the past but will complete your check-in at the cancer center.            _____________________________________________________________  Should you have questions after your visit to [facility], please contact our office at [phone] and follow the prompts.  Our office hours are 8:00 a.m. to 4:30 p.m. Monday - Thursday and 8:00 a.m. to 2:30 p.m. Friday.  Please note that voicemails left after 4:00 p.m. may not be returned  until the following business day.  We are closed weekends and all major holidays.  You do have access to a nurse 24-7, just call the main number to the clinic [phone] and do not press any options, hold on the line and a nurse will answer the phone.    For prescription refill requests, have your pharmacy contact our office and allow 72 hours.    Masks are no longer required in the cancer centers. If you would like for your care team to wear a mask while they are taking care of you, please let them know. You may have one support person who is at least 79 years old accompany you for your appointments.

## 2022-06-10 ENCOUNTER — Telehealth: Payer: Self-pay

## 2022-06-10 NOTE — Telephone Encounter (Signed)
A nurse with [facility] wanted to know if patient received his Eligard injection.  She wanted patient to be scheduled to have this done soon.  The medication was ordered on 03/28 visit but never given.  Patient states he did not receive the injection at this visit.  Does patient need to be scheduled for this injection this week or does he need an OV with you?  Patient states at the time of the 03/28 visit he was unsure if he would continue his Eligard injection.  Please advise.

## 2022-06-12 NOTE — Telephone Encounter (Signed)
Left voicemail for patient to return call to schedule NV for 45mg  Eligard injection per Dr. Annabell Howells instructions.

## 2022-06-13 ENCOUNTER — Other Ambulatory Visit: Payer: Self-pay | Admitting: *Deleted

## 2022-06-13 ENCOUNTER — Other Ambulatory Visit (HOSPITAL_COMMUNITY): Payer: Self-pay

## 2022-06-13 ENCOUNTER — Other Ambulatory Visit: Payer: Self-pay

## 2022-06-13 DIAGNOSIS — C61 Malignant neoplasm of prostate: Secondary | ICD-10-CM

## 2022-06-13 MED ORDER — ENZALUTAMIDE 40 MG PO TABS
160.0000 mg | ORAL_TABLET | Freq: Every day | ORAL | 3 refills | Status: DC
Start: 2022-06-13 — End: 2022-09-23
  Filled 2022-06-13 (×3): qty 120, 30d supply, fill #0
  Filled 2022-07-01: qty 120, 30d supply, fill #1
  Filled 2022-07-23 – 2022-07-28 (×2): qty 120, 30d supply, fill #2
  Filled 2022-08-21: qty 120, 30d supply, fill #3

## 2022-06-13 NOTE — Telephone Encounter (Signed)
Refill for Xtandi approved.  Patient is tolerating and is to continue therapy.

## 2022-06-13 NOTE — Telephone Encounter (Signed)
Unable to reach patient and leave voice message due to mail box is full.

## 2022-06-13 NOTE — Telephone Encounter (Signed)
Unable to reach patient after multiple attempts and voicemails.  Will continue to try to reach patient to schedule NV injection.

## 2022-06-17 NOTE — Telephone Encounter (Signed)
FYI Unable to reach patient after multiple attempts in trying to schedule NV for injections.  Patient scheduled to follow up with you on 10/23/2022.

## 2022-06-24 ENCOUNTER — Inpatient Hospital Stay: Payer: 59

## 2022-07-01 ENCOUNTER — Other Ambulatory Visit (HOSPITAL_COMMUNITY): Payer: Self-pay

## 2022-07-03 NOTE — Telephone Encounter (Signed)
Still unable to reach patient by phone, letter mailed today requesting patient contact our office to schedule a apt for injection.

## 2022-07-07 ENCOUNTER — Other Ambulatory Visit: Payer: Self-pay

## 2022-07-07 ENCOUNTER — Other Ambulatory Visit (HOSPITAL_COMMUNITY): Payer: Self-pay

## 2022-07-23 ENCOUNTER — Other Ambulatory Visit (HOSPITAL_COMMUNITY): Payer: Self-pay

## 2022-07-28 ENCOUNTER — Other Ambulatory Visit (HOSPITAL_COMMUNITY): Payer: Self-pay

## 2022-07-28 ENCOUNTER — Other Ambulatory Visit: Payer: Self-pay

## 2022-07-29 ENCOUNTER — Other Ambulatory Visit (HOSPITAL_COMMUNITY): Payer: Self-pay

## 2022-07-30 ENCOUNTER — Other Ambulatory Visit (HOSPITAL_COMMUNITY): Payer: Self-pay

## 2022-08-02 ENCOUNTER — Other Ambulatory Visit (HOSPITAL_COMMUNITY): Payer: Self-pay

## 2022-08-04 ENCOUNTER — Inpatient Hospital Stay: Payer: 59 | Attending: Oncology

## 2022-08-11 ENCOUNTER — Inpatient Hospital Stay: Payer: 59 | Admitting: Hematology

## 2022-08-19 ENCOUNTER — Other Ambulatory Visit (HOSPITAL_COMMUNITY): Payer: Self-pay

## 2022-08-21 ENCOUNTER — Other Ambulatory Visit (HOSPITAL_COMMUNITY): Payer: Self-pay

## 2022-08-26 ENCOUNTER — Other Ambulatory Visit: Payer: Self-pay

## 2022-09-03 DIAGNOSIS — C771 Secondary and unspecified malignant neoplasm of intrathoracic lymph nodes: Secondary | ICD-10-CM | POA: Diagnosis not present

## 2022-09-03 DIAGNOSIS — Z79899 Other long term (current) drug therapy: Secondary | ICD-10-CM | POA: Diagnosis not present

## 2022-09-23 ENCOUNTER — Other Ambulatory Visit: Payer: Self-pay | Admitting: Hematology

## 2022-09-23 ENCOUNTER — Other Ambulatory Visit (HOSPITAL_COMMUNITY): Payer: Self-pay

## 2022-09-23 ENCOUNTER — Other Ambulatory Visit: Payer: Self-pay

## 2022-09-23 DIAGNOSIS — C61 Malignant neoplasm of prostate: Secondary | ICD-10-CM

## 2022-09-23 MED ORDER — ENZALUTAMIDE 40 MG PO TABS
160.0000 mg | ORAL_TABLET | Freq: Every day | ORAL | 3 refills | Status: DC
Start: 2022-09-23 — End: 2022-10-01
  Filled 2022-09-23: qty 120, 30d supply, fill #0

## 2022-09-24 ENCOUNTER — Inpatient Hospital Stay: Payer: 59 | Attending: Oncology

## 2022-09-24 ENCOUNTER — Other Ambulatory Visit (HOSPITAL_COMMUNITY): Payer: Self-pay

## 2022-09-24 DIAGNOSIS — C61 Malignant neoplasm of prostate: Secondary | ICD-10-CM | POA: Diagnosis present

## 2022-09-24 LAB — COMPREHENSIVE METABOLIC PANEL
ALT: 15 U/L (ref 0–44)
AST: 18 U/L (ref 15–41)
Albumin: 3.8 g/dL (ref 3.5–5.0)
Alkaline Phosphatase: 78 U/L (ref 38–126)
Anion gap: 8 (ref 5–15)
BUN: 18 mg/dL (ref 8–23)
CO2: 25 mmol/L (ref 22–32)
Calcium: 9.2 mg/dL (ref 8.9–10.3)
Chloride: 102 mmol/L (ref 98–111)
Creatinine, Ser: 1.03 mg/dL (ref 0.61–1.24)
GFR, Estimated: >60 mL/min (ref >60)
Glucose, Bld: 99 mg/dL (ref 70–99)
Potassium: 3.8 mmol/L (ref 3.5–5.1)
Sodium: 135 mmol/L (ref 135–145)
Total Bilirubin: 1 mg/dL (ref 0.3–1.2)
Total Protein: 7.4 g/dL (ref 6.5–8.1)

## 2022-09-24 LAB — CBC WITH DIFFERENTIAL/PLATELET
Abs Immature Granulocytes: 0.01 10*3/uL (ref 0.00–0.07)
Basophils Absolute: 0 10*3/uL (ref 0.0–0.1)
Basophils Relative: 1 %
Eosinophils Absolute: 0.5 10*3/uL (ref 0.0–0.5)
Eosinophils Relative: 10 %
HCT: 36.1 % — ABNORMAL LOW (ref 39.0–52.0)
Hemoglobin: 12.4 g/dL — ABNORMAL LOW (ref 13.0–17.0)
Immature Granulocytes: 0 %
Lymphocytes Relative: 36 %
Lymphs Abs: 1.9 10*3/uL (ref 0.7–4.0)
MCH: 32.5 pg (ref 26.0–34.0)
MCHC: 34.3 g/dL (ref 30.0–36.0)
MCV: 94.5 fL (ref 80.0–100.0)
Monocytes Absolute: 0.5 10*3/uL (ref 0.1–1.0)
Monocytes Relative: 10 %
Neutro Abs: 2.3 10*3/uL (ref 1.7–7.7)
Neutrophils Relative %: 43 %
Platelets: 189 10*3/uL (ref 150–400)
RBC: 3.82 MIL/uL — ABNORMAL LOW (ref 4.22–5.81)
RDW: 13.1 % (ref 11.5–15.5)
WBC: 5.3 10*3/uL (ref 4.0–10.5)
nRBC: 0 % (ref 0.0–0.2)

## 2022-09-24 LAB — PSA: Prostatic Specific Antigen: 3.79 ng/mL (ref 0.00–4.00)
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
## 2022-10-01 ENCOUNTER — Other Ambulatory Visit (HOSPITAL_COMMUNITY): Payer: Self-pay

## 2022-10-01 ENCOUNTER — Other Ambulatory Visit: Payer: Self-pay

## 2022-10-01 ENCOUNTER — Telehealth: Payer: Self-pay

## 2022-10-01 ENCOUNTER — Inpatient Hospital Stay: Payer: 59 | Attending: Oncology | Admitting: Hematology

## 2022-10-01 VITALS — BP 121/87 | HR 71 | Temp 98.1°F | Resp 18 | Ht 69.29 in | Wt 225.7 lb

## 2022-10-01 DIAGNOSIS — C61 Malignant neoplasm of prostate: Secondary | ICD-10-CM | POA: Diagnosis not present

## 2022-10-01 DIAGNOSIS — C779 Secondary and unspecified malignant neoplasm of lymph node, unspecified: Secondary | ICD-10-CM | POA: Insufficient documentation

## 2022-10-01 DIAGNOSIS — Z803 Family history of malignant neoplasm of breast: Secondary | ICD-10-CM | POA: Insufficient documentation

## 2022-10-01 DIAGNOSIS — E559 Vitamin D deficiency, unspecified: Secondary | ICD-10-CM | POA: Insufficient documentation

## 2022-10-01 MED ORDER — PREDNISONE 5 MG PO TABS: 5.0000 mg | ORAL_TABLET | Freq: Every day | ORAL | 6 refills | Status: DC

## 2022-10-01 MED ORDER — ABIRATERONE ACETATE 250 MG PO TABS
1000.0000 mg | ORAL_TABLET | Freq: Every day | ORAL | 2 refills | Status: DC
Start: 2022-10-01 — End: 2022-12-19
  Filled 2022-10-01 – 2022-10-02 (×2): qty 120, 30d supply, fill #0
  Filled 2022-10-21: qty 120, 30d supply, fill #1
  Filled 2022-11-19: qty 120, 30d supply, fill #2

## 2022-10-01 NOTE — Telephone Encounter (Signed)
Oral Oncology Patient Advocate Encounter  Prior Authorization for Abiraterone has been approved.    PA# [id number]  Effective dates: 10/01/22 through 01/27/23  Patients co-pay is $0.00.    Ardeen Fillers, CPhT Oncology Pharmacy Patient Advocate  [facility]  [phone] (phone) [phone] (fax) 10/01/2022 2:44 PM

## 2022-10-01 NOTE — Patient Instructions (Addendum)
[facility] Discharge Instructions   You were seen and examined today by Dr. Ellin Saba.  He reviewed the results of your lab work. Your PSA is continuing to rise. Dr. Kirtland Bouchard wants you to stop taking Xtandi. He is sending in a new prescription for you called Zytiga. You will need to take a steroid with this. The steroid is prednisone 5 mg daily. The Roosvelt Maser is taken on an empty stomach. The prednisone should be taken every morning with breakfast.   We will see you back in 5 weeks. We will repeat lab work at that time.   Return as scheduled.    Thank you for choosing  Cancer Center at [facility] to provide your oncology and hematology care.  To afford each patient quality time with our provider, please arrive at least 15 minutes before your scheduled appointment time.   If you have a lab appointment with the Cancer Center please come in thru the Main Entrance and check in at the main information desk.  You need to re-schedule your appointment should you arrive 10 or more minutes late.  We strive to give you quality time with our providers, and arriving late affects you and other patients whose appointments are after yours.  Also, if you no show three or more times for appointments you may be dismissed from the clinic at the providers discretion.     Again, thank you for choosing [facility].  Our hope is that these requests will decrease the amount of time that you wait before being seen by our physicians.       _____________________________________________________________  Should you have questions after your visit to [facility], please contact our office at [phone] and follow the prompts.  Our office hours are 8:00 a.m. and 4:30 p.m. Monday - Friday.  Please note that voicemails left after 4:00 p.m. may not be returned until the following business day.  We are closed weekends and major holidays.  You do have  access to a nurse 24-7, just call the main number to the clinic [phone] and do not press any options, hold on the line and a nurse will answer the phone.    For prescription refill requests, have your pharmacy contact our office and allow 72 hours.    Due to Covid, you will need to wear a mask upon entering the hospital. If you do not have a mask, a mask will be given to you at the Main Entrance upon arrival. For doctor visits, patients may have 1 support person age 79 or older with them. For treatment visits, patients can not have anyone with them due to social distancing guidelines and our immunocompromised population.

## 2022-10-01 NOTE — Progress Notes (Signed)
 Patient is taking Xtandi as prescribed.  He has not missed any doses and reports no side effects at this time.

## 2022-10-01 NOTE — Telephone Encounter (Signed)
Clinical Pharmacist Encounter   Received new prescription for Zytiga (abiraterone) with leuprolide INJ and prednisone 5 mg daily for the treatment of metastatic resistant prostate cancer. Planned duration until disease control or until intolerable toxicities.  Labs from 09/24/2022 assessed, no concerns for starting the medication. No concerns for needing a dose reduction currently. Prescription dose and frequency assessed.   Current medication list in Epic reviewed, 1 DDIs with tamsulosin identified: Tamsulosin is a CYP2D6 substrate. Use with Zytiga, a CYP2D6 inhibitor, may increase exposure to tamsulosin. Dose reduction not required, but monitor for increased risk of side effects such as hypertension and fluid retention. Consider discontinuing tamsulosin if appropriate if side effects develop during treatment.   Evaluated chart and no patient barriers to medication adherence identified.   Prescription has been e-scribed to the [facility] for benefits analysis and approval.  Oral Oncology Clinic will continue to follow for insurance authorization, copayment issues, initial counseling and start date.  Patient agreed to treatment on 10/01/2022 per MD documentation.  Gara Kroner, PharmD PGY1 Pharmacy Resident - [facility] [place] [place] [phone]  10/01/2022 1:24 PM

## 2022-10-01 NOTE — Telephone Encounter (Signed)
Oral Oncology Patient Advocate Encounter  New authorization   Received notification that prior authorization for Abiraterone is required.   PA submitted on 10/01/22  Key [id number]  Status is pending     Ardeen Fillers, CPhT Oncology Pharmacy Patient Advocate  [facility]  [phone] (phone) [phone] (fax) 10/01/2022 2:43 PM

## 2022-10-02 ENCOUNTER — Other Ambulatory Visit: Payer: Self-pay

## 2022-10-02 ENCOUNTER — Other Ambulatory Visit (HOSPITAL_COMMUNITY): Payer: Self-pay

## 2022-10-02 NOTE — Telephone Encounter (Signed)
Patient successfully OnBoarded and drug education provided by pharmacist. Medication scheduled to be shipped on Monday, 10/06/22 for delivery on Tuesday, 10/07/22 from [facility] to patient's address. Patient also knows to call me at [phone] with any questions or concerns regarding receiving medication or if there is any unexpected change in co-pay.    Ardeen Fillers, CPhT Oncology Pharmacy Patient Advocate  [facility]  [phone] (phone) [phone] (fax) 10/02/2022 1:21 PM

## 2022-10-02 NOTE — Telephone Encounter (Signed)
Clinical Pharmacist Encounter   Patient Education I spoke with the patient and his wife for an overview of new oral chemotherapy medication: Zytiga (abiraterone) for the treatment of metastatic castration resistant prostate cancer. Planned duration until disease control or until intolerable toxicities.   Counseled patient on administration, dosing, side effects, monitoring, drug-food interactions, safe handling, storage, and disposal. Patient will take 4 tablets (1000 mg) daily. They will take the medication on an empty stomach 1 hours before or 2 hours after a meal.  The patient will also be taking prednisone 5 mg once daily with food.   Side effects include but not limited to: Increased blood pressure, fluid retention, muscle weakness or joint pain, liver harm, decreased WBCs and increased cholesterol: Increased blood pressure: Look out for increased headache, dizziness, chest pain. Advised to get a BP cuff to monitor and to let their provider know if they start developing these symptoms Fluid Retention: Advised patient to keep an eye on their ankles for increase swelling/bloating. Let their provider know if this develops Muscle pain or weakness: Advised the patient to let their provider know if they have increases in pain while on this medication.    Advised patient that their provider will be monitoring their LFTs, WBCs, and cholesterol regularly in clinic while they're on this medication  Reviewed with patient importance of keeping a medication schedule and plan for any missed doses.  After discussion with patient no patient barriers to medication adherence identified. Patient acknowledged they have a prescription at their local pharmacy for prednisone, but have not picked it up yet.   Mr. Pryde voiced understanding and appreciation. All questions answered. Medication handout mailed to their home address. [address]  [place] [address]   Provided patient with Oral Chemotherapy  Navigation [facility] phone number. Patient knows to call the office with questions or concerns. Oral Chemotherapy Navigation Clinic will continue to follow.  Gara Kroner, PharmD PGY1 Pharmacy Resident - [facility] [place] [phone]  10/02/2022 1:32 PM

## 2022-10-02 NOTE — Telephone Encounter (Signed)
[employer] [facility] will deliver medication to patient on 10/07/22. Patient knows to get started when he has medication in hand.

## 2022-10-08 ENCOUNTER — Ambulatory Visit (HOSPITAL_COMMUNITY)
Admission: RE | Admit: 2022-10-08 | Discharge: 2022-10-08 | Disposition: A | Discharge status: Home / Self Care | Payer: 59 | Source: Ambulatory Visit | Attending: Hematology | Admitting: Hematology

## 2022-10-08 DIAGNOSIS — C61 Malignant neoplasm of prostate: Secondary | ICD-10-CM | POA: Diagnosis present

## 2022-10-08 DIAGNOSIS — M85832 Other specified disorders of bone density and structure, left forearm: Secondary | ICD-10-CM | POA: Insufficient documentation

## 2022-10-08 DIAGNOSIS — Z13820 Encounter for screening for osteoporosis: Secondary | ICD-10-CM | POA: Insufficient documentation

## 2022-10-10 ENCOUNTER — Encounter (HOSPITAL_COMMUNITY): Payer: Self-pay

## 2022-10-10 ENCOUNTER — Emergency Department (HOSPITAL_COMMUNITY)
Admission: EM | Admit: 2022-10-10 | Discharge: 2022-10-10 | Disposition: A | Discharge status: Home / Self Care | Payer: 59 | Attending: Emergency Medicine | Admitting: Emergency Medicine

## 2022-10-10 ENCOUNTER — Emergency Department (HOSPITAL_COMMUNITY): Payer: 59

## 2022-10-10 ENCOUNTER — Other Ambulatory Visit: Payer: Self-pay

## 2022-10-10 DIAGNOSIS — M79605 Pain in left leg: Secondary | ICD-10-CM | POA: Diagnosis not present

## 2022-10-10 DIAGNOSIS — R600 Localized edema: Secondary | ICD-10-CM | POA: Diagnosis not present

## 2022-10-10 DIAGNOSIS — M25562 Pain in left knee: Secondary | ICD-10-CM | POA: Insufficient documentation

## 2022-10-10 DIAGNOSIS — Z8546 Personal history of malignant neoplasm of prostate: Secondary | ICD-10-CM | POA: Diagnosis not present

## 2022-10-10 DIAGNOSIS — M1712 Unilateral primary osteoarthritis, left knee: Secondary | ICD-10-CM | POA: Diagnosis not present

## 2022-10-10 DIAGNOSIS — S80912A Unspecified superficial injury of left knee, initial encounter: Secondary | ICD-10-CM | POA: Diagnosis not present

## 2022-10-10 MED ORDER — IBUPROFEN 600 MG PO TABS: 600.0000 mg | ORAL_TABLET | Freq: Three times a day (TID) | ORAL | 0 refills | Status: DC

## 2022-10-10 NOTE — ED Triage Notes (Signed)
C/o left leg pain x2 weeks. Per family swelling to left ankle and pain increase with palpitation.  Pt reports pulling sensation behind left knee and into left calf.  Pain is improved when sitting.  Sent by NP to r/o DVT

## 2022-10-10 NOTE — ED Provider Triage Note (Signed)
Emergency Medicine Provider Triage Evaluation Note  Troy Walter , a 77 y.o. male  was evaluated in triage.  Pt complains of neck pain and swelling.  Patient reports that over the last 2 weeks or so he has been experiencing increased swelling and pain in the left leg typically worsen with ambulation.  Reporting pain to palpation in the posterior aspect of the left leg along the left gastrocnemius and left posterior aspect of the knee.  Feels that he has a pulling sensation behind the left knee which goes into the calf.  Pain typically improves with sitting.  Not currently on any blood thinners.  Denies chest pain shortness of breath.  Review of Systems  Positive: As above Negative: As above  Physical Exam  BP 139/66   Pulse (!) 56   Temp 98.2 F (36.8 C) (Oral)   Resp 18   Wt 102 kg   SpO2 96%   BMI 32.93 kg/m  Gen:   Awake, no distress   Resp:  Normal effort  MSK:   Moves extremities without difficulty, pain to palpation along the posterior aspect of the left knee Other:  Trace edema in the left leg  Medical Decision Making  Medically screening exam initiated at 2:43 PM.  Appropriate orders placed.  Troy Walter was informed that the remainder of the evaluation will be completed by another provider, this initial triage assessment does not replace that evaluation, and the importance of remaining in the ED until their evaluation is complete.     Smitty Knudsen, PA-C 10/10/22 1444

## 2022-10-10 NOTE — Discharge Instructions (Signed)
Wear the knee brace when walking or standing.  You may also need to use a cane or walker to help keep your balance.  You may apply warm heat off-and-on to your knee.  Call the orthopedic provider listed to arrange follow-up appointment.  Return to emergency department for any new or worsening symptoms.

## 2022-10-10 NOTE — Progress Notes (Signed)
Patient's wife is calling stating that patient has been limping around on his left leg since he had the bone density scan. She states that it is not really red or warm to the touch but that his leg is swollen right around his knee area. She is wanting to know if we think she should take him to the ER or what she needs to do. She mentioned that she is not sure if he might have a blood clot.   Response from Durenda Hurt, NP -Yes I would say he needs it evaluated for possible blood clot .  Patient wife aware and agreeable with plan. She asked that the emergency department be made aware before they come. Charge nurse in emergency department made aware and is expecting patient to come today.

## 2022-10-10 NOTE — ED Provider Notes (Signed)
[place] EMERGENCY DEPARTMENT AT [facility] Provider Note   CSN: [id number] Arrival date & time: 10/10/22  1345     History {Add pertinent medical, surgical, social history, OB history to HPI:1} No chief complaint on file.   Troy Walter is a 77 y.o. male.  HPI      Troy Walter is a 77 y.o. male with past medical tree of bradycardia, prostate cancer who presents to the Emergency Department sent by PCP for evaluation of possible DVT.  Complains of left knee pain x 2 weeks.  He notes feeling that his left leg is swollen from his knee to his ankle.  Patient spouse has noticed a gradually worsening a limp with weightbearing.  Pain worse today.  He describes a throbbing and pulling sensation from his medial left knee to his upper calf.  Pain resolves while at rest.  No history of DVT or PE.  He is unsure if there has been a possible injury.    Home Medications Prior to Admission medications   Medication Sig Start Date End Date Taking? Authorizing Provider  abiraterone acetate (ZYTIGA) 250 MG tablet Take 4 tablets (1,000 mg total) by mouth daily. Take on an empty stomach 1 hour before or 2 hours after a meal 10/01/22   Doreatha Massed, MD  Calcium Carbonate (CALCIUM 500 PO) 1 tablet 1200mg     [provider]  ergocalciferol (VITAMIN D2) 1.25 MG (50000 UT) capsule Take 1 capsule (50,000 Units total) by mouth once a week. 06/09/22   Doreatha Massed, MD  ibuprofen (ADVIL) 800 MG tablet Take 800 mg by mouth every 8 (eight) hours as needed for moderate pain. 04/18/19   [provider]  POTASSIUM CHLORIDE ER PO 1 tablet    [provider]  predniSONE (DELTASONE) 5 MG tablet Take 1 tablet (5 mg total) by mouth daily with breakfast. 10/01/22   Doreatha Massed, MD  tamsulosin (FLOMAX) 0.4 MG CAPS capsule TAKE 1 CAPSULE BY MOUTH IN THE  EVENING 04/23/22   Bjorn Pippin, MD      Allergies    Patient has no known allergies.    Review of  Systems   Review of Systems  Constitutional:  Negative for appetite change, chills and fever.  Respiratory:  Negative for shortness of breath.   Cardiovascular:  Negative for chest pain.  Gastrointestinal:  Negative for nausea and vomiting.  Musculoskeletal:  Positive for arthralgias (Left knee pain). Negative for joint swelling.  Skin:  Negative for color change, rash and wound.  Neurological:  Negative for weakness.  Psychiatric/Behavioral:  Negative for confusion.     Physical Exam Updated Vital Signs BP 139/66   Pulse (!) 56   Temp 98.2 F (36.8 C) (Oral)   Resp 18   Wt 102 kg   SpO2 96%   BMI 32.93 kg/m  Physical Exam Vitals and nursing note reviewed.  Constitutional:      Appearance: Normal appearance.  HENT:     Head: Atraumatic.  Cardiovascular:     Rate and Rhythm: Normal rate and regular rhythm.     Pulses: Normal pulses.  Pulmonary:     Effort: Pulmonary effort is normal.  Chest:     Chest wall: No tenderness.  Musculoskeletal:        General: Tenderness present. No swelling or deformity.     Right lower leg: No edema.     Left lower leg: No edema.     Comments: Palpable varicosities of the left  lower extremity.  Calf is soft.  Tender to palpation medial aspect of the left knee joint.  No obvious ligamentous instability on exam.  Skin:    General: Skin is warm.     Capillary Refill: Capillary refill takes less than 2 seconds.  Neurological:     General: No focal deficit present.     Mental Status: He is alert.     Sensory: No sensory deficit.     Motor: No weakness.     ED Results / Procedures / Treatments   Labs (all labs ordered are listed, but only abnormal results are displayed) Labs Reviewed - No data to display  EKG None  Radiology US Venous Img Lower  Left (DVT Study)  Result Date: 10/10/2022 CLINICAL DATA:  Left lower extremity pain and edema for the past 2 weeks. Evaluate for DVT. EXAM: LEFT LOWER EXTREMITY VENOUS DOPPLER ULTRASOUND  TECHNIQUE: Gray-scale sonography with graded compression, as well as color Doppler and duplex ultrasound were performed to evaluate the lower extremity deep venous systems from the level of the common femoral vein and including the common femoral, femoral, profunda femoral, popliteal and calf veins including the posterior tibial, peroneal and gastrocnemius veins when visible. The superficial great saphenous vein was also interrogated. Spectral Doppler was utilized to evaluate flow at rest and with distal augmentation maneuvers in the common femoral, femoral and popliteal veins. COMPARISON:  None Available. FINDINGS: Contralateral Common Femoral Vein: Respiratory phasicity is normal and symmetric with the symptomatic side. No evidence of thrombus. Normal compressibility. Common Femoral Vein: No evidence of thrombus. Normal compressibility, respiratory phasicity and response to augmentation. Saphenofemoral Junction: No evidence of thrombus. Normal compressibility and flow on color Doppler imaging. Profunda Femoral Vein: No evidence of thrombus. Normal compressibility and flow on color Doppler imaging. Femoral Vein: No evidence of thrombus. Normal compressibility, respiratory phasicity and response to augmentation. Popliteal Vein: No evidence of thrombus. Normal compressibility, respiratory phasicity and response to augmentation. Calf Veins: No evidence of thrombus. Normal compressibility and flow on color Doppler imaging. Superficial Great Saphenous Vein: No evidence of thrombus. Normal compressibility. Other Findings: Mildly prominent though widely patent superficial varicosities at the level of the left calf (image 40). IMPRESSION: 1. No evidence of DVT within the left lower extremity. 2. Mildly prominent though widely patent superficial varicosities at the level of the left calf. Electronically Signed   By: Simonne Come M.D.   On: 10/10/2022 15:54    Procedures Procedures  {Document cardiac monitor, telemetry  assessment procedure when appropriate:1}  Medications Ordered in ED Medications - No data to display  ED Course/ Medical Decision Making/ A&P   {   Click here for ABCD2, HEART and other calculatorsREFRESH Note before signing :1}                              Medical Decision Making Patient here for evaluation of left leg pain, sent here by PCP for DVT rule out.  Has been having pain of his left knee and upper calf area x 2 weeks.  Endorses swelling.  Pain is worse with weightbearing and improves at rest.  Unsure if he has had a possible injury but denies any known fall.  Amount and/or Complexity of Data Reviewed Radiology: ordered.    Details: venous ultrasound of the left lower extremity negative for DVT     {Document critical care time when appropriate:1} {Document review of labs and clinical decision tools ie  heart score, Chads2Vasc2 etc:1}  {Document your independent review of radiology images, and any outside records:1} {Document your discussion with family members, caretakers, and with consultants:1} {Document social determinants of health affecting pt's care:1} {Document your decision making why or why not admission, treatments were needed:1} Final Clinical Impression(s) / ED Diagnoses Final diagnoses:  None    Rx / DC Orders ED Discharge Orders     None

## 2022-10-21 ENCOUNTER — Other Ambulatory Visit: Payer: Self-pay | Admitting: Hematology

## 2022-10-21 ENCOUNTER — Other Ambulatory Visit: Payer: Self-pay

## 2022-10-21 NOTE — Progress Notes (Signed)
Specialty Pharmacy Ongoing Clinical Assessment Note  Troy Walter is a 77 y.o. male who is being followed by the specialty pharmacy service for RxSp Oncology   Review of patient's specialty medication(s) Abiraterone Acetate  occurred today.   Patient has missed 0  doses in the last 4 weeks.   Patient did not have any additional questions or concerns.   Therapeutic benefit summary: Patient is achieving benefit   Adverse events/side effects summary: No adverse events/side effects   Patient's therapy is appropriate to : Continue    Goals      Slow Disease Progression     Patient is on track . Patient will maintain adherence         Follow up:  6 months

## 2022-10-21 NOTE — Progress Notes (Signed)
Specialty Pharmacy Refill Coordination Note  Troy Walter is a 77 y.o. male contacted today regarding refills of specialty medication(s) Abiraterone Acetate .  Patient requested Delivery  on 10/31/22  to verified address [address]   Medication will be filled on 10/30/22.

## 2022-10-23 ENCOUNTER — Ambulatory Visit: Payer: 59 | Admitting: Urology

## 2022-10-29 ENCOUNTER — Inpatient Hospital Stay: Payer: 59 | Attending: Oncology

## 2022-10-29 DIAGNOSIS — C61 Malignant neoplasm of prostate: Secondary | ICD-10-CM | POA: Diagnosis present

## 2022-10-29 DIAGNOSIS — E559 Vitamin D deficiency, unspecified: Secondary | ICD-10-CM | POA: Diagnosis not present

## 2022-10-29 DIAGNOSIS — C779 Secondary and unspecified malignant neoplasm of lymph node, unspecified: Secondary | ICD-10-CM | POA: Diagnosis not present

## 2022-10-29 LAB — CBC WITH DIFFERENTIAL/PLATELET
Abs Immature Granulocytes: 0.02 10*3/uL (ref 0.00–0.07)
Basophils Absolute: 0 10*3/uL (ref 0.0–0.1)
Basophils Relative: 1 %
Eosinophils Absolute: 0.3 10*3/uL (ref 0.0–0.5)
Eosinophils Relative: 5 %
HCT: 37.3 % — ABNORMAL LOW (ref 39.0–52.0)
Hemoglobin: 12.5 g/dL — ABNORMAL LOW (ref 13.0–17.0)
Immature Granulocytes: 0 %
Lymphocytes Relative: 31 %
Lymphs Abs: 1.6 10*3/uL (ref 0.7–4.0)
MCH: 32.1 pg (ref 26.0–34.0)
MCHC: 33.5 g/dL (ref 30.0–36.0)
MCV: 95.9 fL (ref 80.0–100.0)
Monocytes Absolute: 0.4 10*3/uL (ref 0.1–1.0)
Monocytes Relative: 8 %
Neutro Abs: 2.9 10*3/uL (ref 1.7–7.7)
Neutrophils Relative %: 55 %
Platelets: 202 10*3/uL (ref 150–400)
RBC: 3.89 MIL/uL — ABNORMAL LOW (ref 4.22–5.81)
RDW: 13.2 % (ref 11.5–15.5)
WBC: 5.2 10*3/uL (ref 4.0–10.5)
nRBC: 0 % (ref 0.0–0.2)

## 2022-10-29 LAB — COMPREHENSIVE METABOLIC PANEL
ALT: 16 U/L (ref 0–44)
AST: 19 U/L (ref 15–41)
Albumin: 4 g/dL (ref 3.5–5.0)
Alkaline Phosphatase: 81 U/L (ref 38–126)
Anion gap: 9 (ref 5–15)
BUN: 14 mg/dL (ref 8–23)
CO2: 27 mmol/L (ref 22–32)
Calcium: 9.4 mg/dL (ref 8.9–10.3)
Chloride: 100 mmol/L (ref 98–111)
Creatinine, Ser: 1.04 mg/dL (ref 0.61–1.24)
GFR, Estimated: >60 mL/min (ref >60)
Glucose, Bld: 101 mg/dL — ABNORMAL HIGH (ref 70–99)
Potassium: 4.1 mmol/L (ref 3.5–5.1)
Sodium: 136 mmol/L (ref 135–145)
Total Bilirubin: 0.8 mg/dL (ref 0.3–1.2)
Total Protein: 7.3 g/dL (ref 6.5–8.1)

## 2022-10-29 LAB — MAGNESIUM: Magnesium: 2.1 mg/dL (ref 1.7–2.4)

## 2022-10-29 LAB — VITAMIN D 25 HYDROXY (VIT D DEFICIENCY, FRACTURES): Vit D, 25-Hydroxy: 54.39 ng/mL (ref 30–100)

## 2022-11-05 ENCOUNTER — Inpatient Hospital Stay: Payer: 59 | Admitting: Hematology

## 2022-11-19 ENCOUNTER — Other Ambulatory Visit: Payer: Self-pay

## 2022-11-19 NOTE — Progress Notes (Signed)
Specialty Pharmacy Refill Coordination Note  Troy Walter is a 77 y.o. male contacted today regarding refills of specialty medication(s) Abiraterone Acetate   Patient requested Delivery   Delivery date: 11/28/22   Verified address: [address]   Medication will be filled on 11/27/22.

## 2022-12-04 ENCOUNTER — Ambulatory Visit: Payer: 59 | Admitting: Urology

## 2022-12-04 ENCOUNTER — Encounter: Payer: Self-pay | Admitting: Urology

## 2022-12-04 VITALS — BP 134/68 | HR 67

## 2022-12-04 DIAGNOSIS — C775 Secondary and unspecified malignant neoplasm of intrapelvic lymph nodes: Secondary | ICD-10-CM

## 2022-12-04 DIAGNOSIS — N3941 Urge incontinence: Secondary | ICD-10-CM | POA: Diagnosis not present

## 2022-12-04 DIAGNOSIS — Z8546 Personal history of malignant neoplasm of prostate: Secondary | ICD-10-CM | POA: Diagnosis not present

## 2022-12-04 DIAGNOSIS — C61 Malignant neoplasm of prostate: Secondary | ICD-10-CM

## 2022-12-04 DIAGNOSIS — R9721 Rising PSA following treatment for malignant neoplasm of prostate: Secondary | ICD-10-CM

## 2022-12-04 DIAGNOSIS — R3915 Urgency of urination: Secondary | ICD-10-CM

## 2022-12-04 DIAGNOSIS — N403 Nodular prostate with lower urinary tract symptoms: Secondary | ICD-10-CM | POA: Diagnosis not present

## 2022-12-04 LAB — URINALYSIS, ROUTINE W REFLEX MICROSCOPIC
Bilirubin, UA: NEGATIVE
Glucose, UA: NEGATIVE
Ketones, UA: NEGATIVE
Leukocytes,UA: NEGATIVE
Nitrite, UA: NEGATIVE
RBC, UA: NEGATIVE
Specific Gravity, UA: 1.02 (ref 1.005–1.030)
Urobilinogen, Ur: 2 mg/dL — ABNORMAL HIGH (ref 0.2–1.0)
pH, UA: 7 (ref 5.0–7.5)

## 2022-12-04 NOTE — Progress Notes (Signed)
post void residual=71

## 2022-12-04 NOTE — Progress Notes (Signed)
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 investigate further.   Nodular prostate with BOO and moderate LUTS on tamsulosin which he will continue..    No orders of the defined types were placed in this encounter.     Orders Placed This Encounter  Procedures   Urinalysis, Routine w reflex microscopic   PSA   Testosterone   BLADDER SCAN AMB NON-IMAGING      Return in about 6 months (around 06/03/2023).   CC: Dr. Starr Lake.     Troy Walter 12/05/2022 Patient ID: Santiago Bumpers  Troy Walter, male   DOB: February 07, 1945, 77 y.o.   MRN: [id number]

## 2022-12-05 ENCOUNTER — Telehealth: Payer: Self-pay

## 2022-12-05 LAB — TESTOSTERONE: Testosterone: <3 ng/dL — ABNORMAL LOW (ref 264–916)

## 2022-12-05 LAB — PSA: Prostate Specific Ag, Serum: 3.6 ng/mL (ref 0.0–4.0)

## 2022-12-05 NOTE — Telephone Encounter (Signed)
-----   Message from Bjorn Pippin sent at 12/05/2022  8:49 AM EST ----- His testosterone remains very low at <3 so he doesn't need redosing with eligard.  HIs PSA is 3.6 which is down  just a small amount from his level of 3.79 in August. ----- Message ----- From: Grier Rocher, CMA Sent: 12/05/2022   7:54 AM EST To: Bjorn Pippin, MD  Please review.

## 2022-12-05 NOTE — Telephone Encounter (Signed)
Patient is aware of MD response to lab results.

## 2022-12-12 IMAGING — CT NM PET TUM IMG SKULL BASE T - THIGH
1 of 7 series · 1 of 25 positions shown · non-contrast
Comparison: None.

CLINICAL DATA: Prostate carcinoma with biochemical recurrence.
Rising PSA.

EXAM:
NUCLEAR MEDICINE PET SKULL BASE TO THIGH
TECHNIQUE: 8.4 mCi F18 Piflufolastat (Pylarify) was injected intravenously.
Full-ring PET imaging was performed from the skull base to thigh
after the radiotracer. CT data was obtained and used for attenuation
correction and anatomic localization.

[Series 3: pet sk_thigh ac · axial · 5.0mm · 4.07mm/px · 1 of 244 slices shown]
[im 146/244]
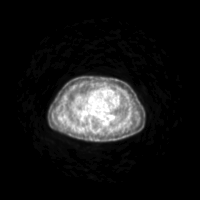

[1 of 25 positions shown; findings below may reference images not displayed]

FINDINGS: NECK

Small radiotracer avid LEFT supraclavicular node measures 7 mm
(image 64/series 4) with SUV max equal 7.4. Several small nodes
slightly more superior (image 60) also have radiotracer activity

Incidental CT finding: None

CHEST

There multiple radiotracer avid small LEFT paratracheal and
prevascular nodes. Cluster of nodes just superior to the aortic arch
with SUV max equal 6.3. Larger prevascular node measuring 7 mm
(image 83/4) has intense radiotracer activity SUV max equal 17.5.

Several radiotracer avid lymph nodes along the descending thoracic
aorta inferior to the carina with SUV max equal 7.5 (image 87)

Incidental CT finding: No suspicious pulmonary nodules.

ABDOMEN/PELVIS

Prostate: There is mild activity within the prostate gland centrally
in the LEFT and RIGHT with SUV max equal 5.7.

Lymph nodes: No radiotracer avid pelvic lymph nodes. There are
multiple radiotracer avid periaortic lymph nodes in the
retroperitoneum LEFT and RIGHT of the aorta. For example node LEFT
aorta at the level the inferior pole of the kidneys with SUV max
equal 6.5 on image 148/4.

Lymph node between the IVC and aorta distant the bifurcation
measures 6 mm SUV max equal 14.7 (image 160/4).

Liver: No evidence of liver metastasis

Incidental CT finding: Moderate size LEFT bladder diverticulum
measures 2.5 cm (image [DATE])

SKELETON

No focal  activity to suggest skeletal metastasis.
IMPRESSION: 1. Mild radiotracer activity within the prostate gland is
indeterminate.
2. Metastatic radiotracer avid adenopathy in the periaortic
retroperitoneum.
3. Distant nodal metastatic disease in the LEFT mediastinum and LEFT
supraclavicular nodal.
4. No visceral metastasis or skeletal metastasis .

## 2022-12-19 ENCOUNTER — Other Ambulatory Visit: Payer: Self-pay

## 2022-12-19 ENCOUNTER — Other Ambulatory Visit: Payer: Self-pay | Admitting: Hematology

## 2022-12-19 DIAGNOSIS — C61 Malignant neoplasm of prostate: Secondary | ICD-10-CM

## 2022-12-19 NOTE — Progress Notes (Addendum)
Specialty Pharmacy Refill Coordination Note  Troy Walter is a 77 y.o. male contacted today regarding refills of specialty medication(s) Abiraterone Acetate Spoke with patient's wife, Troy Walter.  Patient requested Delivery   Delivery date: 12/26/22   Verified address: [address]   Medication will be filled on 12/24/22.  Refill request pending. Notify if delayed.

## 2022-12-23 ENCOUNTER — Other Ambulatory Visit: Payer: Self-pay | Admitting: *Deleted

## 2022-12-23 ENCOUNTER — Other Ambulatory Visit: Payer: Self-pay

## 2022-12-23 MED ORDER — ABIRATERONE ACETATE 250 MG PO TABS
1000.0000 mg | ORAL_TABLET | Freq: Every day | ORAL | 2 refills | Status: DC
Start: 2022-12-23 — End: 2023-03-23
  Filled 2022-12-23: qty 120, 30d supply, fill #0
  Filled 2023-01-20: qty 120, 30d supply, fill #1
  Filled 2023-02-13: qty 120, 30d supply, fill #2

## 2022-12-23 NOTE — Telephone Encounter (Signed)
Zytiga refill approved.  Patient is tolerating and is to continue therapy.

## 2023-01-03 ENCOUNTER — Other Ambulatory Visit: Payer: Self-pay | Admitting: Urology

## 2023-01-03 DIAGNOSIS — N403 Nodular prostate with lower urinary tract symptoms: Secondary | ICD-10-CM

## 2023-01-20 ENCOUNTER — Other Ambulatory Visit (HOSPITAL_COMMUNITY): Payer: Self-pay

## 2023-01-20 ENCOUNTER — Other Ambulatory Visit: Payer: Self-pay

## 2023-01-20 NOTE — Progress Notes (Signed)
Specialty Pharmacy Refill Coordination Note  Troy Walter is a 77 y.o. male contacted today regarding refills of specialty medication(s) Abiraterone Acetate Troy Walter)   Patient requested Delivery   Delivery date: 01/23/23   Verified address: [address]   Medication will be filled on 01/22/23.

## 2023-02-10 ENCOUNTER — Other Ambulatory Visit (HOSPITAL_COMMUNITY): Payer: Self-pay

## 2023-02-13 ENCOUNTER — Other Ambulatory Visit: Payer: Self-pay

## 2023-02-13 NOTE — Progress Notes (Signed)
Specialty Pharmacy Refill Coordination Note  Troy Walter is a 78 y.o. male contacted today regarding refills of specialty medication(s) Abiraterone Acetate (ZYTIGA) Spoke with patient's wife   Patient requested Delivery   Delivery date: 02/20/23   Verified address: [address]   Medication will be filled on 01.23.25.

## 2023-02-18 ENCOUNTER — Other Ambulatory Visit: Payer: Self-pay

## 2023-02-18 ENCOUNTER — Other Ambulatory Visit (HOSPITAL_COMMUNITY): Payer: Self-pay

## 2023-03-03 ENCOUNTER — Other Ambulatory Visit: Payer: Self-pay | Admitting: Hematology

## 2023-03-12 ENCOUNTER — Encounter: Payer: Self-pay | Admitting: Urology

## 2023-03-20 ENCOUNTER — Other Ambulatory Visit: Payer: Self-pay

## 2023-03-20 ENCOUNTER — Other Ambulatory Visit (HOSPITAL_COMMUNITY): Payer: Self-pay

## 2023-03-23 ENCOUNTER — Other Ambulatory Visit: Payer: Self-pay | Admitting: Hematology

## 2023-03-23 ENCOUNTER — Other Ambulatory Visit: Payer: Self-pay

## 2023-03-23 DIAGNOSIS — C61 Malignant neoplasm of prostate: Secondary | ICD-10-CM

## 2023-03-23 NOTE — Progress Notes (Signed)
 Specialty Pharmacy Refill Coordination Note  Troy Walter is a 78 y.o. male contacted today regarding refills of specialty medication(s) Abiraterone Acetate (ZYTIGA)   Patient requested Delivery   Delivery date: 03/26/23   Verified address: [address]   Medication will be filled on 03/25/23, pending refill approval.   This fill date is pending response to refill request from provider. Patient is aware and if they have not received fill by intended date they must follow up with pharmacy.

## 2023-03-24 ENCOUNTER — Other Ambulatory Visit: Payer: Self-pay

## 2023-03-24 MED ORDER — ABIRATERONE ACETATE 250 MG PO TABS
1000.0000 mg | ORAL_TABLET | Freq: Every day | ORAL | 2 refills | Status: DC
Start: 2023-03-24 — End: 2023-06-15
  Filled 2023-03-24: qty 120, 30d supply, fill #0
  Filled 2023-04-24: qty 120, 30d supply, fill #1
  Filled 2023-05-19: qty 120, 30d supply, fill #2

## 2023-03-30 ENCOUNTER — Other Ambulatory Visit: Payer: Self-pay

## 2023-03-30 DIAGNOSIS — C61 Malignant neoplasm of prostate: Secondary | ICD-10-CM
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
## 2023-03-31 ENCOUNTER — Inpatient Hospital Stay: Payer: 59

## 2023-03-31 ENCOUNTER — Inpatient Hospital Stay: Payer: 59 | Attending: Hematology | Admitting: Hematology

## 2023-03-31 VITALS — BP 137/86 | HR 56 | Temp 97.6°F | Resp 17 | Ht 70.0 in | Wt 212.0 lb

## 2023-03-31 DIAGNOSIS — C778 Secondary and unspecified malignant neoplasm of lymph nodes of multiple regions: Secondary | ICD-10-CM | POA: Diagnosis not present

## 2023-03-31 DIAGNOSIS — C61 Malignant neoplasm of prostate: Secondary | ICD-10-CM

## 2023-03-31 LAB — CBC WITH DIFFERENTIAL/PLATELET
Abs Immature Granulocytes: 0.01 10*3/uL (ref 0.00–0.07)
Basophils Absolute: 0 10*3/uL (ref 0.0–0.1)
Basophils Relative: 1 %
Eosinophils Absolute: 0.6 10*3/uL — ABNORMAL HIGH (ref 0.0–0.5)
Eosinophils Relative: 10 %
HCT: 38.8 % — ABNORMAL LOW (ref 39.0–52.0)
Hemoglobin: 13.1 g/dL (ref 13.0–17.0)
Immature Granulocytes: 0 %
Lymphocytes Relative: 40 %
Lymphs Abs: 2.3 10*3/uL (ref 0.7–4.0)
MCH: 31.6 pg (ref 26.0–34.0)
MCHC: 33.8 g/dL (ref 30.0–36.0)
MCV: 93.7 fL (ref 80.0–100.0)
Monocytes Absolute: 0.5 10*3/uL (ref 0.1–1.0)
Monocytes Relative: 8 %
Neutro Abs: 2.5 10*3/uL (ref 1.7–7.7)
Neutrophils Relative %: 41 %
Platelets: 188 10*3/uL (ref 150–400)
RBC: 4.14 MIL/uL — ABNORMAL LOW (ref 4.22–5.81)
RDW: 13.1 % (ref 11.5–15.5)
WBC: 5.9 10*3/uL (ref 4.0–10.5)
nRBC: 0 % (ref 0.0–0.2)

## 2023-03-31 LAB — COMPREHENSIVE METABOLIC PANEL
ALT: 15 U/L (ref 0–44)
AST: 20 U/L (ref 15–41)
Albumin: 3.9 g/dL (ref 3.5–5.0)
Alkaline Phosphatase: 70 U/L (ref 38–126)
Anion gap: 9 (ref 5–15)
BUN: 17 mg/dL (ref 8–23)
CO2: 26 mmol/L (ref 22–32)
Calcium: 9.4 mg/dL (ref 8.9–10.3)
Chloride: 103 mmol/L (ref 98–111)
Creatinine, Ser: 1.02 mg/dL (ref 0.61–1.24)
GFR, Estimated: >60 mL/min (ref >60)
Glucose, Bld: 101 mg/dL — ABNORMAL HIGH (ref 70–99)
Potassium: 3.9 mmol/L (ref 3.5–5.1)
Sodium: 138 mmol/L (ref 135–145)
Total Bilirubin: 1 mg/dL (ref 0.0–1.2)
Total Protein: 7.2 g/dL (ref 6.5–8.1)

## 2023-03-31 LAB — PSA: Prostatic Specific Antigen: 8.76 ng/mL — ABNORMAL HIGH (ref 0.00–4.00)

## 2023-03-31 NOTE — Progress Notes (Signed)
 Patient is taking Zytiga as prescribed. He has not missed any doses and reports no side effects at this time.

## 2023-03-31 NOTE — Patient Instructions (Addendum)
 [facility] at [facility] Discharge Instructions   You were seen and examined today by Dr. Ellin Saba.  He reviewed the results of your lab work which are normal/stable.   Continue Zytiga and prednisone as prescribed.   Return as scheduled.    Thank you for choosing [facility] at [facility] to provide your oncology and hematology care.  To afford each patient quality time with our provider, please arrive at least 15 minutes before your scheduled appointment time.   If you have a lab appointment with the Cancer Center please come in thru the Main Entrance and check in at the main information desk.  You need to re-schedule your appointment should you arrive 10 or more minutes late.  We strive to give you quality time with our providers, and arriving late affects you and other patients whose appointments are after yours.  Also, if you no show three or more times for appointments you may be dismissed from the clinic at the providers discretion.     Again, thank you for choosing [facility].  Our hope is that these requests will decrease the amount of time that you wait before being seen by our physicians.       _____________________________________________________________  Should you have questions after your visit to [facility], please contact our office at [phone] and follow the prompts.  Our office hours are 8:00 a.m. and 4:30 p.m. Monday - Friday.  Please note that voicemails left after 4:00 p.m. may not be returned until the following business day.  We are closed weekends and major holidays.  You do have access to a nurse 24-7, just call the main number to the clinic [phone] and do not press any options, hold on the line and a nurse will answer the phone.    For prescription refill requests, have your pharmacy contact our office and allow 72 hours.    Due to Covid, you will need to wear a mask upon  entering the hospital. If you do not have a mask, a mask will be given to you at the Main Entrance upon arrival. For doctor visits, patients may have 1 support person age 48 or older with them. For treatment visits, patients can not have anyone with them due to social distancing guidelines and our immunocompromised population.

## 2023-04-02 ENCOUNTER — Other Ambulatory Visit: Payer: Self-pay | Admitting: *Deleted

## 2023-04-02 DIAGNOSIS — C61 Malignant neoplasm of prostate: Secondary | ICD-10-CM

## 2023-04-02 LAB — TESTOSTERONE: Testosterone: <3 ng/dL — ABNORMAL LOW (ref 264–916)

## 2023-04-14 ENCOUNTER — Other Ambulatory Visit: Payer: Self-pay

## 2023-04-15 ENCOUNTER — Other Ambulatory Visit: Payer: Self-pay

## 2023-04-15 DIAGNOSIS — C61 Malignant neoplasm of prostate: Secondary | ICD-10-CM

## 2023-04-16 ENCOUNTER — Ambulatory Visit (HOSPITAL_COMMUNITY): Admission: RE | Admit: 2023-04-16 | Source: Ambulatory Visit

## 2023-04-16 ENCOUNTER — Inpatient Hospital Stay

## 2023-04-20 ENCOUNTER — Other Ambulatory Visit (HOSPITAL_COMMUNITY): Payer: Self-pay

## 2023-04-22 ENCOUNTER — Other Ambulatory Visit: Payer: Self-pay

## 2023-04-22 DIAGNOSIS — Z79899 Other long term (current) drug therapy: Secondary | ICD-10-CM | POA: Diagnosis not present

## 2023-04-22 DIAGNOSIS — Z0000 Encounter for general adult medical examination without abnormal findings: Secondary | ICD-10-CM | POA: Diagnosis not present

## 2023-04-22 DIAGNOSIS — R7301 Impaired fasting glucose: Secondary | ICD-10-CM | POA: Diagnosis not present

## 2023-04-22 DIAGNOSIS — C771 Secondary and unspecified malignant neoplasm of intrathoracic lymph nodes: Secondary | ICD-10-CM | POA: Diagnosis not present

## 2023-04-22 DIAGNOSIS — E7800 Pure hypercholesterolemia, unspecified: Secondary | ICD-10-CM | POA: Diagnosis not present

## 2023-04-23 ENCOUNTER — Inpatient Hospital Stay: Admitting: Hematology

## 2023-04-23 ENCOUNTER — Other Ambulatory Visit (HOSPITAL_COMMUNITY): Payer: Self-pay

## 2023-04-24 ENCOUNTER — Other Ambulatory Visit: Payer: Self-pay

## 2023-04-24 ENCOUNTER — Other Ambulatory Visit: Payer: Self-pay | Admitting: Pharmacy Technician

## 2023-04-24 NOTE — Progress Notes (Signed)
 Specialty Pharmacy Refill Coordination Note  Troy Walter is a 78 y.o. male contacted today regarding refills of specialty medication(s) Abiraterone Acetate (ZYTIGA)   Patient requested Delivery   Delivery date: 04/29/23   Verified address: [address] [place]   Medication will be filled on 04/28/23.

## 2023-04-24 NOTE — Progress Notes (Signed)
 Specialty Pharmacy Ongoing Clinical Assessment Note  Troy Walter is a 78 y.o. male who is being followed by the specialty pharmacy service for RxSp Oncology   Patient's specialty medication(s) reviewed today: Abiraterone Acetate (ZYTIGA)   Missed doses in the last 4 weeks: 0   Patient/Caregiver did not have any additional questions or concerns.   Therapeutic benefit summary: Unable to assess (PSA increased to 8.76 at visit on 03/31/23. Provider plans to assess if regimen changes need to be made.)   Adverse events/side effects summary: No adverse events/side effects   Patient's therapy is appropriate to: Continue    Goals Addressed             This Visit's Progress    Slow Disease Progression       Patient is not on track and no change. Patient will be monitored by provider to determine if a change in treatment plan is warranted. PSA increased to 8.76 at visit on 03/31/23. Provider plans to assess if regimen changes need to be made.           Follow up:  6 months  Otto Herb Specialty Pharmacist

## 2023-04-28 ENCOUNTER — Other Ambulatory Visit: Payer: Self-pay

## 2023-04-29 ENCOUNTER — Other Ambulatory Visit: Payer: Self-pay

## 2023-04-29 DIAGNOSIS — Z1379 Encounter for other screening for genetic and chromosomal anomalies: Secondary | ICD-10-CM

## 2023-04-29 DIAGNOSIS — C61 Malignant neoplasm of prostate: Secondary | ICD-10-CM

## 2023-04-30 ENCOUNTER — Inpatient Hospital Stay: Attending: Hematology

## 2023-04-30 ENCOUNTER — Encounter (HOSPITAL_COMMUNITY)
Admission: RE | Admit: 2023-04-30 | Discharge: 2023-04-30 | Disposition: A | Discharge status: Home / Self Care | Source: Ambulatory Visit | Attending: Hematology | Admitting: Hematology

## 2023-04-30 DIAGNOSIS — Z1379 Encounter for other screening for genetic and chromosomal anomalies: Secondary | ICD-10-CM

## 2023-04-30 DIAGNOSIS — C778 Secondary and unspecified malignant neoplasm of lymph nodes of multiple regions: Secondary | ICD-10-CM | POA: Insufficient documentation

## 2023-04-30 DIAGNOSIS — C61 Malignant neoplasm of prostate: Secondary | ICD-10-CM | POA: Diagnosis present

## 2023-04-30 LAB — CBC WITH DIFFERENTIAL/PLATELET
Abs Immature Granulocytes: 0.01 10*3/uL (ref 0.00–0.07)
Basophils Absolute: 0 10*3/uL (ref 0.0–0.1)
Basophils Relative: 1 %
Eosinophils Absolute: 0.5 10*3/uL (ref 0.0–0.5)
Eosinophils Relative: 8 %
HCT: 35.8 % — ABNORMAL LOW (ref 39.0–52.0)
Hemoglobin: 12 g/dL — ABNORMAL LOW (ref 13.0–17.0)
Immature Granulocytes: 0 %
Lymphocytes Relative: 17 %
Lymphs Abs: 1 10*3/uL (ref 0.7–4.0)
MCH: 32.2 pg (ref 26.0–34.0)
MCHC: 33.5 g/dL (ref 30.0–36.0)
MCV: 96 fL (ref 80.0–100.0)
Monocytes Absolute: 0.4 10*3/uL (ref 0.1–1.0)
Monocytes Relative: 6 %
Neutro Abs: 3.9 10*3/uL (ref 1.7–7.7)
Neutrophils Relative %: 68 %
Platelets: 185 10*3/uL (ref 150–400)
RBC: 3.73 MIL/uL — ABNORMAL LOW (ref 4.22–5.81)
RDW: 13.2 % (ref 11.5–15.5)
WBC: 5.8 10*3/uL (ref 4.0–10.5)
nRBC: 0 % (ref 0.0–0.2)

## 2023-04-30 LAB — COMPREHENSIVE METABOLIC PANEL WITH GFR
ALT: 15 U/L (ref 0–44)
AST: 22 U/L (ref 15–41)
Albumin: 3.5 g/dL (ref 3.5–5.0)
Alkaline Phosphatase: 67 U/L (ref 38–126)
Anion gap: 8 (ref 5–15)
BUN: 17 mg/dL (ref 8–23)
CO2: 26 mmol/L (ref 22–32)
Calcium: 8.9 mg/dL (ref 8.9–10.3)
Chloride: 103 mmol/L (ref 98–111)
Creatinine, Ser: 0.95 mg/dL (ref 0.61–1.24)
GFR, Estimated: >60 mL/min (ref >60)
Glucose, Bld: 96 mg/dL (ref 70–99)
Potassium: 3.9 mmol/L (ref 3.5–5.1)
Sodium: 137 mmol/L (ref 135–145)
Total Bilirubin: 0.9 mg/dL (ref 0.0–1.2)
Total Protein: 6.6 g/dL (ref 6.5–8.1)

## 2023-04-30 LAB — MAGNESIUM: Magnesium: 2 mg/dL (ref 1.7–2.4)

## 2023-04-30 MED ORDER — FLOTUFOLASTAT F 18 GALLIUM 296-5846 MBQ/ML IV SOLN
8.3800 | Freq: Once | INTRAVENOUS | Status: AC
  Administered 2023-04-30: 8.38 via INTRAVENOUS
  Filled 2023-04-30: qty 9

## 2023-05-07 ENCOUNTER — Inpatient Hospital Stay (HOSPITAL_BASED_OUTPATIENT_CLINIC_OR_DEPARTMENT_OTHER): Admitting: Hematology

## 2023-05-07 VITALS — BP 130/91 | HR 77 | Temp 97.9°F | Resp 16 | Wt 212.3 lb

## 2023-05-07 DIAGNOSIS — C61 Malignant neoplasm of prostate: Secondary | ICD-10-CM

## 2023-05-07 DIAGNOSIS — C778 Secondary and unspecified malignant neoplasm of lymph nodes of multiple regions: Secondary | ICD-10-CM | POA: Diagnosis not present

## 2023-05-07 NOTE — Patient Instructions (Addendum)
 [facility] at [facility] Discharge Instructions   You were seen and examined today by Dr. Ellin Saba.  He reviewed the results of your PET scan. It is showing lymph nodes lighting up in the neck, upper chest, and lower back.    We need to change your treatment. Dr. Kirtland Bouchard would like for you to have a biopsy to test if your prostate cancer is eligible for treatment with a pill called a PARP inhibitor.   STOP taking Zytiga and prednisone.   We will see you back 4 weeks after the biopsy to review the results.   Return as scheduled.    Thank you for choosing [facility] at [facility] to provide your oncology and hematology care.  To afford each patient quality time with our provider, please arrive at least 15 minutes before your scheduled appointment time.   If you have a lab appointment with the Cancer Center please come in thru the Main Entrance and check in at the main information desk.  You need to re-schedule your appointment should you arrive 10 or more minutes late.  We strive to give you quality time with our providers, and arriving late affects you and other patients whose appointments are after yours.  Also, if you no show three or more times for appointments you may be dismissed from the clinic at the providers discretion.     Again, thank you for choosing [facility].  Our hope is that these requests will decrease the amount of time that you wait before being seen by our physicians.       _____________________________________________________________  Should you have questions after your visit to [facility], please contact our office at [phone] and follow the prompts.  Our office hours are 8:00 a.m. and 4:30 p.m. Monday - Friday.  Please note that voicemails left after 4:00 p.m. may not be returned until the following business day.  We are closed weekends and major holidays.  You do have access to a nurse  24-7, just call the main number to the clinic [phone] and do not press any options, hold on the line and a nurse will answer the phone.    For prescription refill requests, have your pharmacy contact our office and allow 72 hours.    Due to Covid, you will need to wear a mask upon entering the hospital. If you do not have a mask, a mask will be given to you at the Main Entrance upon arrival. For doctor visits, patients may have 1 support person age 37 or older with them. For treatment visits, patients can not have anyone with them due to social distancing guidelines and our immunocompromised population.
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
## 2023-05-07 NOTE — Progress Notes (Signed)
 Patient is taking Zytiga as prescribed. He has not missed any doses and reports no side effects at this time.

## 2023-05-08 NOTE — Progress Notes (Unsigned)
 Irish Lack, MD  Shirlean Mylar L PROCEDURE / BIOPSY REVIEW Date: 05/07/23  Requested Biopsy site: Lymph node; left supraclavicular safest. All nodes are small. Reason for request: Met prostate CA Imaging review: Best seen on Plarify PET  Decision: Approved Imaging modality to perform: Ultrasound Schedule with: No sedation / Local anesthetic Schedule for: Any VIR  Additional comments:   Please contact me with questions, concerns, or if issue pertaining to this request arise.  Reola Calkins, MD [facility] Radiology       Previous Messages    ----- Message ----- From: Alain Marion Sent: 05/07/2023  12:55 PM EDT To: Alain Marion; Ir Procedure Requests Subject: [place] CORE BIOPSY (LYMPH NODES)                  Procedure :[place] CORE BIOPSY (LYMPH NODES)  Reason :left lower neck LN bx; metastatic prostate cancer Dx: Prostate cancer (HCC) [place] (ICD-10-CM)]  History :NM PET (PSMA) SKULL TO MID THIGH,DG Knee Complete 4 Views Left ,US Venous Img Lower  Left (DVT Study),DG Bone Density  Provider: Doreatha Massed, MD  Provider contact ; [phone]

## 2023-05-09 ENCOUNTER — Other Ambulatory Visit: Payer: Self-pay | Admitting: Hematology

## 2023-05-19 ENCOUNTER — Other Ambulatory Visit: Payer: Self-pay

## 2023-05-19 ENCOUNTER — Other Ambulatory Visit: Payer: Self-pay | Admitting: Pharmacy Technician

## 2023-05-19 NOTE — Progress Notes (Signed)
 Specialty Pharmacy Refill Coordination Note  Troy Walter is a 78 y.o. male contacted today regarding refills of specialty medication(s) Abiraterone  Acetate (ZYTIGA )   Patient requested Delivery   Delivery date: 05/22/23   Verified address: [address]  [place] [address]   Medication will be filled on 05/21/23.     Spoke to patient's spouse for refill

## 2023-06-01 ENCOUNTER — Ambulatory Visit (HOSPITAL_COMMUNITY)
Admission: RE | Admit: 2023-06-01 | Discharge: 2023-06-01 | Disposition: A | Discharge status: Home / Self Care | Source: Ambulatory Visit | Attending: Hematology | Admitting: Hematology

## 2023-06-01 DIAGNOSIS — C61 Malignant neoplasm of prostate: Secondary | ICD-10-CM | POA: Diagnosis present

## 2023-06-01 DIAGNOSIS — R599 Enlarged lymph nodes, unspecified: Secondary | ICD-10-CM | POA: Diagnosis not present

## 2023-06-01 DIAGNOSIS — C770 Secondary and unspecified malignant neoplasm of lymph nodes of head, face and neck: Secondary | ICD-10-CM | POA: Diagnosis not present

## 2023-06-01 MED ORDER — LIDOCAINE HCL 1 % IJ SOLN
INTRAMUSCULAR | Status: AC
  Filled 2023-06-01: qty 20

## 2023-06-01 NOTE — Procedures (Signed)
 Interventional Radiology Procedure Note  Procedure: US  Guided Biopsy of left supraclavicular lymph node  Complications: None  Estimated Blood Loss: < 10 mL  Findings: 18 G core biopsy of left supraclavicular lymph node performed under US  guidance.  5 core samples obtained and sent to Pathology.  Nonda Bays. Nereida Banning, M.D Pager:  [phone]

## 2023-06-03 LAB — SURGICAL PATHOLOGY

## 2023-06-04 ENCOUNTER — Ambulatory Visit: Payer: 59 | Admitting: Urology

## 2023-06-15 ENCOUNTER — Other Ambulatory Visit: Payer: Self-pay

## 2023-06-15 ENCOUNTER — Other Ambulatory Visit: Payer: Self-pay | Admitting: Hematology

## 2023-06-15 DIAGNOSIS — C61 Malignant neoplasm of prostate: Secondary | ICD-10-CM

## 2023-06-15 MED ORDER — ABIRATERONE ACETATE 250 MG PO TABS
1000.0000 mg | ORAL_TABLET | Freq: Every day | ORAL | 2 refills | Status: DC
  Filled 2023-06-15: qty 120, 30d supply, fill #0

## 2023-06-15 NOTE — Progress Notes (Signed)
 Specialty Pharmacy Refill Coordination Note  Troy Walter is a 78 y.o. male contacted today regarding refills of specialty medication(s) Abiraterone  Acetate (ZYTIGA )   Patient requested Delivery   Delivery date: 06/18/23   Verified address: [address]  [place] [address]   Medication will be filled on 06/17/23.

## 2023-06-18 ENCOUNTER — Inpatient Hospital Stay: Attending: Hematology | Admitting: Hematology

## 2023-06-18 VITALS — BP 136/75 | HR 62 | Temp 98.5°F | Resp 20 | Wt 212.1 lb

## 2023-06-18 DIAGNOSIS — C61 Malignant neoplasm of prostate: Secondary | ICD-10-CM

## 2023-06-18 DIAGNOSIS — Z807 Family history of other malignant neoplasms of lymphoid, hematopoietic and related tissues: Secondary | ICD-10-CM | POA: Insufficient documentation

## 2023-06-18 DIAGNOSIS — Z808 Family history of malignant neoplasm of other organs or systems: Secondary | ICD-10-CM | POA: Insufficient documentation

## 2023-06-18 DIAGNOSIS — Z803 Family history of malignant neoplasm of breast: Secondary | ICD-10-CM | POA: Diagnosis not present

## 2023-06-18 DIAGNOSIS — C778 Secondary and unspecified malignant neoplasm of lymph nodes of multiple regions: Secondary | ICD-10-CM | POA: Insufficient documentation

## 2023-06-18 DIAGNOSIS — Z8042 Family history of malignant neoplasm of prostate: Secondary | ICD-10-CM | POA: Diagnosis not present

## 2023-06-18 NOTE — Patient Instructions (Signed)
 [facility] at [facility] Discharge Instructions   You were seen and examined today by Dr. Cheree Cords.  He discussed with you a non-chemotherapy treatment option called Pluvicto. This is a targeted therapy that goes after the prostate cancer cells. It is given by interventional radiology. It is given every 6 weeks for a total of 6 treatments.   You may stop taking the Zytiga  (cancer pill). The prednisone  you should start taking every other day for 10 days and then stop taking.   Return as scheduled.    Thank you for choosing [facility] at [facility] to provide your oncology and hematology care.  To afford each patient quality time with our provider, please arrive at least 15 minutes before your scheduled appointment time.   If you have a lab appointment with the Cancer Center please come in thru the Main Entrance and check in at the main information desk.  You need to re-schedule your appointment should you arrive 10 or more minutes late.  We strive to give you quality time with our providers, and arriving late affects you and other patients whose appointments are after yours.  Also, if you no show three or more times for appointments you may be dismissed from the clinic at the providers discretion.     Again, thank you for choosing [facility].  Our hope is that these requests will decrease the amount of time that you wait before being seen by our physicians.       _____________________________________________________________  Should you have questions after your visit to [facility], please contact our office at [phone] and follow the prompts.  Our office hours are 8:00 a.m. and 4:30 p.m. Monday - Friday.  Please note that voicemails left after 4:00 p.m. may not be returned until the following business day.  We are closed weekends and major holidays.  You do have access to a nurse 24-7, just call the main number to  the clinic [phone] and do not press any options, hold on the line and a nurse will answer the phone.    For prescription refill requests, have your pharmacy contact our office and allow 72 hours.    Due to Covid, you will need to wear a mask upon entering the hospital. If you do not have a mask, a mask will be given to you at the Main Entrance upon arrival. For doctor visits, patients may have 1 support person age 76 or older with them. For treatment visits, patients can not have anyone with them due to social distancing guidelines and our immunocompromised population.
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
## 2023-06-19 ENCOUNTER — Other Ambulatory Visit: Payer: Self-pay | Admitting: Hematology

## 2023-06-19 DIAGNOSIS — C61 Malignant neoplasm of prostate: Secondary | ICD-10-CM

## 2023-06-25 ENCOUNTER — Ambulatory Visit: Payer: 59 | Admitting: Urology

## 2023-06-25 VITALS — BP 106/66 | HR 73

## 2023-06-25 DIAGNOSIS — N403 Nodular prostate with lower urinary tract symptoms: Secondary | ICD-10-CM | POA: Diagnosis not present

## 2023-06-25 DIAGNOSIS — N138 Other obstructive and reflux uropathy: Secondary | ICD-10-CM | POA: Diagnosis not present

## 2023-06-25 DIAGNOSIS — C61 Malignant neoplasm of prostate: Secondary | ICD-10-CM

## 2023-06-25 DIAGNOSIS — C779 Secondary and unspecified malignant neoplasm of lymph node, unspecified: Secondary | ICD-10-CM | POA: Diagnosis not present

## 2023-06-25 LAB — URINALYSIS, ROUTINE W REFLEX MICROSCOPIC
Bilirubin, UA: NEGATIVE
Glucose, UA: NEGATIVE
Ketones, UA: NEGATIVE
Leukocytes,UA: NEGATIVE
Nitrite, UA: NEGATIVE
RBC, UA: NEGATIVE
Specific Gravity, UA: 1.025 (ref 1.005–1.030)
Urobilinogen, Ur: 0.2 mg/dL (ref 0.2–1.0)
pH, UA: 6 (ref 5.0–7.5)

## 2023-06-25 MED ORDER — TAMSULOSIN HCL 0.4 MG PO CAPS: 0.4000 mg | ORAL_CAPSULE | Freq: Every day | ORAL | 2 refills | Status: DC

## 2023-06-25 NOTE — Progress Notes (Unsigned)
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 level.   Nodular prostate with BOO and moderate LUTS on tamsulosin  which I have refilled. .    Meds ordered this encounter  Medications   tamsulosin  (FLOMAX ) 0.4 MG CAPS capsule    Sig: Take 1 capsule (0.4 mg total) by mouth daily.    Dispense:  100 capsule    Refill:  2    He doesn't need it yet.      Orders Placed This Encounter  Procedures   Urinalysis, Routine w reflex microscopic      Return in about 6 months (around 12/26/2023) for Available MD..   CC: Dr. Dibas Kiorala and Dr. Paulett Boros. .     Troy Walter 06/26/2023 Patient ID: Troy Walter, male   DOB: 01/15/1946, 78 y.o.   MRN: [id number]

## 2023-06-26 ENCOUNTER — Encounter: Payer: Self-pay | Admitting: Urology

## 2023-06-30 DIAGNOSIS — C770 Secondary and unspecified malignant neoplasm of lymph nodes of head, face and neck: Secondary | ICD-10-CM | POA: Diagnosis not present

## 2023-07-10 ENCOUNTER — Other Ambulatory Visit: Payer: Self-pay | Admitting: Pharmacy Technician

## 2023-07-10 ENCOUNTER — Other Ambulatory Visit: Payer: Self-pay

## 2023-07-10 NOTE — Progress Notes (Signed)
 Oral Oncology Patient Advocate Encounter  Disenrolling patient since medication was discontinued on 06/18/2023 with progression per MD notes.  Maud Rubendall (Patty) Benjaman Branch, CPhT  [facility], [place], [place]ine Done, [place] Oral Chemotherapy Patient Advocate Phone: [phone]  Fax: [phone]

## 2023-07-24 ENCOUNTER — Other Ambulatory Visit: Payer: Self-pay | Admitting: Urology

## 2023-07-24 DIAGNOSIS — N403 Nodular prostate with lower urinary tract symptoms: Secondary | ICD-10-CM

## 2023-08-04 ENCOUNTER — Encounter (HOSPITAL_COMMUNITY): Admission: RE | Admit: 2023-08-04 | Source: Ambulatory Visit

## 2023-08-12 ENCOUNTER — Inpatient Hospital Stay: Admitting: Hematology

## 2023-08-12 ENCOUNTER — Inpatient Hospital Stay

## 2023-08-24 ENCOUNTER — Inpatient Hospital Stay

## 2023-08-24 ENCOUNTER — Inpatient Hospital Stay: Admitting: Hematology

## 2023-08-24 ENCOUNTER — Inpatient Hospital Stay: Attending: Hematology

## 2023-08-24 ENCOUNTER — Inpatient Hospital Stay (HOSPITAL_BASED_OUTPATIENT_CLINIC_OR_DEPARTMENT_OTHER): Admitting: Hematology

## 2023-08-24 VITALS — BP 169/92 | HR 55 | Temp 97.2°F | Resp 18 | Ht 69.69 in | Wt 207.0 lb

## 2023-08-24 DIAGNOSIS — C778 Secondary and unspecified malignant neoplasm of lymph nodes of multiple regions: Secondary | ICD-10-CM | POA: Insufficient documentation

## 2023-08-24 DIAGNOSIS — Z8042 Family history of malignant neoplasm of prostate: Secondary | ICD-10-CM | POA: Diagnosis not present

## 2023-08-24 DIAGNOSIS — Z803 Family history of malignant neoplasm of breast: Secondary | ICD-10-CM | POA: Diagnosis not present

## 2023-08-24 DIAGNOSIS — C61 Malignant neoplasm of prostate: Secondary | ICD-10-CM | POA: Insufficient documentation

## 2023-08-24 DIAGNOSIS — Z807 Family history of other malignant neoplasms of lymphoid, hematopoietic and related tissues: Secondary | ICD-10-CM | POA: Insufficient documentation

## 2023-08-24 DIAGNOSIS — Z808 Family history of malignant neoplasm of other organs or systems: Secondary | ICD-10-CM | POA: Insufficient documentation

## 2023-08-24 LAB — COMPREHENSIVE METABOLIC PANEL WITH GFR
ALT: 14 U/L (ref 0–44)
AST: 22 U/L (ref 15–41)
Albumin: 3.8 g/dL (ref 3.5–5.0)
Alkaline Phosphatase: 76 U/L (ref 38–126)
Anion gap: 6 (ref 5–15)
BUN: 18 mg/dL (ref 8–23)
CO2: 25 mmol/L (ref 22–32)
Calcium: 9.1 mg/dL (ref 8.9–10.3)
Chloride: 106 mmol/L (ref 98–111)
Creatinine, Ser: 1.16 mg/dL (ref 0.61–1.24)
GFR, Estimated: >60 mL/min (ref >60)
Glucose, Bld: 94 mg/dL (ref 70–99)
Potassium: 3.9 mmol/L (ref 3.5–5.1)
Sodium: 137 mmol/L (ref 135–145)
Total Bilirubin: 0.9 mg/dL (ref 0.0–1.2)
Total Protein: 7.1 g/dL (ref 6.5–8.1)

## 2023-08-24 LAB — CBC WITH DIFFERENTIAL/PLATELET
Abs Immature Granulocytes: 0 K/uL (ref 0.00–0.07)
Basophils Absolute: 0 K/uL (ref 0.0–0.1)
Basophils Relative: 1 %
Eosinophils Absolute: 0.5 K/uL (ref 0.0–0.5)
Eosinophils Relative: 10 %
HCT: 36.8 % — ABNORMAL LOW (ref 39.0–52.0)
Hemoglobin: 12.4 g/dL — ABNORMAL LOW (ref 13.0–17.0)
Immature Granulocytes: 0 %
Lymphocytes Relative: 33 %
Lymphs Abs: 1.6 K/uL (ref 0.7–4.0)
MCH: 31.7 pg (ref 26.0–34.0)
MCHC: 33.7 g/dL (ref 30.0–36.0)
MCV: 94.1 fL (ref 80.0–100.0)
Monocytes Absolute: 0.5 K/uL (ref 0.1–1.0)
Monocytes Relative: 11 %
Neutro Abs: 2.2 K/uL (ref 1.7–7.7)
Neutrophils Relative %: 45 %
Platelets: 175 K/uL (ref 150–400)
RBC: 3.91 MIL/uL — ABNORMAL LOW (ref 4.22–5.81)
RDW: 12.8 % (ref 11.5–15.5)
WBC: 4.9 K/uL (ref 4.0–10.5)
nRBC: 0 % (ref 0.0–0.2)

## 2023-08-24 LAB — PSA: Prostatic Specific Antigen: 8.57 ng/mL — ABNORMAL HIGH (ref 0.00–4.00)

## 2023-08-24 LAB — MAGNESIUM: Magnesium: 2 mg/dL (ref 1.7–2.4)
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
## 2023-08-24 NOTE — Patient Instructions (Addendum)
 [facility] at [facility] Discharge Instructions   You were seen and examined today by Dr. Rogers.  He reviewed the results of your lab work which are normal/stable.   See Dr. Annis as scheduled tomorrow.   Return as scheduled.    Thank you for choosing [facility] at [facility] to provide your oncology and hematology care.  To afford each patient quality time with our provider, please arrive at least 15 minutes before your scheduled appointment time.   If you have a lab appointment with the Cancer Center please come in thru the Main Entrance and check in at the main information desk.  You need to re-schedule your appointment should you arrive 10 or more minutes late.  We strive to give you quality time with our providers, and arriving late affects you and other patients whose appointments are after yours.  Also, if you no show three or more times for appointments you may be dismissed from the clinic at the providers discretion.     Again, thank you for choosing [facility].  Our hope is that these requests will decrease the amount of time that you wait before being seen by our physicians.       _____________________________________________________________  Should you have questions after your visit to [facility], please contact our office at [phone] and follow the prompts.  Our office hours are 8:00 a.m. and 4:30 p.m. Monday - Friday.  Please note that voicemails left after 4:00 p.m. may not be returned until the following business day.  We are closed weekends and major holidays.  You do have access to a nurse 24-7, just call the main number to the clinic [phone] and do not press any options, hold on the line and a nurse will answer the phone.    For prescription refill requests, have your pharmacy contact our office and allow 72 hours.    Due to Covid, you will need to wear a mask upon entering the  hospital. If you do not have a mask, a mask will be given to you at the Main Entrance upon arrival. For doctor visits, patients may have 1 support person age 76 or older with them. For treatment visits, patients can not have anyone with them due to social distancing guidelines and our immunocompromised population.
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
## 2023-08-25 ENCOUNTER — Ambulatory Visit (HOSPITAL_COMMUNITY)
Admission: RE | Admit: 2023-08-25 | Discharge: 2023-08-25 | Disposition: A | Discharge status: Home / Self Care | Source: Ambulatory Visit | Attending: Hematology | Admitting: Hematology

## 2023-08-25 DIAGNOSIS — C61 Malignant neoplasm of prostate: Secondary | ICD-10-CM | POA: Insufficient documentation
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
## 2023-09-20 ENCOUNTER — Other Ambulatory Visit: Payer: Self-pay | Admitting: Urology

## 2023-09-20 DIAGNOSIS — N403 Nodular prostate with lower urinary tract symptoms: Secondary | ICD-10-CM

## 2023-12-28 ENCOUNTER — Ambulatory Visit: Admitting: Urology
